# Patient Record
Sex: Female | Born: 1995 | Race: White | Hispanic: Yes | Smoking: Never smoker
Health system: Southern US, Community
[De-identification: ages and names within clinical notes are randomized; demographics above are authoritative.]

## PROBLEM LIST (undated history)

## (undated) DIAGNOSIS — R519 Headache, unspecified: Secondary | ICD-10-CM

## (undated) DIAGNOSIS — B999 Unspecified infectious disease: Secondary | ICD-10-CM

## (undated) DIAGNOSIS — R0600 Dyspnea, unspecified: Secondary | ICD-10-CM

## (undated) DIAGNOSIS — J45909 Unspecified asthma, uncomplicated: Secondary | ICD-10-CM

## (undated) HISTORY — DX: Headache, unspecified: R51.9

## (undated) HISTORY — DX: Unspecified infectious disease: B99.9

## (undated) HISTORY — DX: Dyspnea, unspecified: R06.00

## (undated) HISTORY — DX: Unspecified asthma, uncomplicated: J45.909

---
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
## 2019-02-21 ENCOUNTER — Other Ambulatory Visit: Payer: Self-pay

## 2019-02-27 ENCOUNTER — Ambulatory Visit (LOCAL_COMMUNITY_HEALTH_CENTER): Payer: Self-pay

## 2019-02-27 ENCOUNTER — Other Ambulatory Visit: Payer: Self-pay

## 2019-02-27 VITALS — BP 113/71 | Ht 61.0 in | Wt 164.5 lb

## 2019-02-27 DIAGNOSIS — Z3201 Encounter for pregnancy test, result positive: Secondary | ICD-10-CM

## 2019-02-27 LAB — PREGNANCY, URINE: Preg Test, Ur: POSITIVE — AB

## 2019-02-27 NOTE — Progress Notes (Signed)
Already has PNV supply. Pt unsure of where she wants to go for prenatal care; declined preadmit.

## 2019-03-27 ENCOUNTER — Other Ambulatory Visit: Payer: Self-pay

## 2019-03-27 ENCOUNTER — Ambulatory Visit: Payer: Medicaid Other | Admitting: Advanced Practice Midwife

## 2019-03-27 ENCOUNTER — Encounter: Payer: Self-pay | Admitting: Advanced Practice Midwife

## 2019-03-27 DIAGNOSIS — O99211 Obesity complicating pregnancy, first trimester: Secondary | ICD-10-CM

## 2019-03-27 DIAGNOSIS — Z23 Encounter for immunization: Secondary | ICD-10-CM

## 2019-03-27 DIAGNOSIS — Z3401 Encounter for supervision of normal first pregnancy, first trimester: Secondary | ICD-10-CM | POA: Diagnosis not present

## 2019-03-27 DIAGNOSIS — O99310 Alcohol use complicating pregnancy, unspecified trimester: Secondary | ICD-10-CM | POA: Insufficient documentation

## 2019-03-27 DIAGNOSIS — O99311 Alcohol use complicating pregnancy, first trimester: Secondary | ICD-10-CM

## 2019-03-27 LAB — URINALYSIS
Bilirubin, UA: NEGATIVE
Glucose, UA: NEGATIVE
Leukocytes,UA: NEGATIVE
Nitrite, UA: NEGATIVE
Protein,UA: NEGATIVE
RBC, UA: NEGATIVE
Specific Gravity, UA: 1.02 (ref 1.005–1.030)
Urobilinogen, Ur: 0.2 mg/dL (ref 0.2–1.0)
pH, UA: 7 (ref 5.0–7.5)

## 2019-03-27 LAB — WET PREP FOR TRICH, YEAST, CLUE
Trichomonas Exam: NEGATIVE
Yeast Exam: NEGATIVE

## 2019-03-27 LAB — HEMOGLOBIN, FINGERSTICK: Hemoglobin: 13.2 g/dL (ref 11.1–15.9)

## 2019-03-27 MED ORDER — PRENATAL VITAMINS 28-0.8 MG PO TABS: 1.0000 | ORAL_TABLET | Freq: Every day | ORAL | 0 refills | Status: DC

## 2019-03-27 NOTE — Progress Notes (Signed)
[facility] HEALTH DEPT [facility] [address] [place] [address] [phone]  INITIAL PRENATAL VISIT NOTE  Subjective:  Brooke Logan is a 23 y.o.SHF feels "happy" about surprise pregnancy with no birth control x 3 years.  23 yo FOB feels "happy" about pregnancy and is employed. Living with FOB and 1 other couple.  LMP 12/05/18.    G1P0000 at [redacted]w[redacted]d being seen today to start prenatal care at the [facility] Department.  She is currently monitored for the following issues for this low-risk pregnancy and has Obesity affecting pregnancy in first trimester BMI=30.7 and Encounter for supervision of normal first pregnancy in first trimester on their problem list.  Patient reports no complaints.  Contractions: Not present. Vag. Bleeding: None.  Movement: Absent. Denies leaking of fluid.   The following portions of the patient's history were reviewed and updated as appropriate: allergies, current medications, past family history, past medical history, past social history, past surgical history and problem list. Problem list updated.  Objective:   Vitals:   03/27/19 1355  BP: 112/71  Temp: 97.8 F (36.6 C)  Weight: 162 lb 9.6 oz (73.8 kg)    Fetal Status: Fetal Heart Rate (bpm): 160 Fundal Height: 10 cm Movement: Absent  Presentation: Undeterminable   Physical Exam Vitals and nursing note reviewed.  Constitutional:      General: She is not in acute distress.    Appearance: Normal appearance. She is well-developed.  HENT:     Head: Normocephalic and atraumatic.     Right Ear: External ear normal.     Left Ear: External ear normal.     Nose: Nose normal. No congestion or rhinorrhea.     Mouth/Throat:     Lips: Pink.     Mouth: Mucous membranes are moist.     Dentition: Normal dentition. No dental caries.     Pharynx: Oropharynx is clear. Uvula midline.  Eyes:     General: No scleral icterus.     Conjunctiva/sclera: Conjunctivae normal.  Neck:     Thyroid: No thyroid mass or thyromegaly.  Cardiovascular:     Rate and Rhythm: Normal rate.     Pulses: Normal pulses.     Comments: Extremities are warm and well perfused Pulmonary:     Effort: Pulmonary effort is normal.     Breath sounds: Normal breath sounds.  Chest:     Breasts: Breasts are symmetrical.        Right: Normal. No mass, nipple discharge or skin change.        Left: Normal. No mass, nipple discharge or skin change.  Abdominal:     General: Abdomen is flat.     Palpations: Abdomen is soft.     Tenderness: There is no abdominal tenderness.     Comments: Gravid soft without tenderness, FHR=160, fundal height 10 wks  Genitourinary:    General: Normal vulva.     Exam position: Lithotomy position.     Pubic Area: No rash.      Labia:        Right: No rash.        Left: No rash.      Vagina: Normal. No vaginal discharge (white creamy ph<4.5).     Cervix: Normal.     Uterus: Normal. Enlarged (Gravid 10-11 wks size). Not tender.      Adnexa: Right adnexa normal and left adnexa normal.     Rectum: Normal. No external hemorrhoid.  Musculoskeletal:     Right lower leg: No edema.     Left lower leg: No edema.  Lymphadenopathy:     Upper Body:     Right upper body: No axillary adenopathy.     Left upper body: No axillary adenopathy.  Skin:    General: Skin is warm.     Capillary Refill: Capillary refill takes less than 2 seconds.  Neurological:     Mental Status: She is alert.     Assessment and Plan:  Pregnancy: G1P0000 at [redacted]w[redacted]d  1. Obesity affecting pregnancy in first trimester BMI=30.7 -5 lb 6.4 oz (-2.449 kg)  Agrees to ASA 81 mg daily  - TSH + free T4  2. Encounter for supervision of normal first pregnancy in first trimester Desires FIRST screen - Glucose, 1 hour gestational - HGB FRAC. W/SOLUBILITY - QuantiFERON-TB Gold Plus - Prenatal profile without Varicella/Rubella ([id number]) - HIV [place]  LAB - Chlamydia/GC NAA, Confirmation - Protein / creatinine ratio, urine  (Spot) - Comprehensive metabolic panel - Hgb H6P w/o eAG - [id number] Drug Screen - Urinalysis (Urine Dip) - WET PREP FOR TRICH, YEAST, CLUE - Hemoglobin, venipuncture    Discussed overview of care and coordination with inpatient delivery practices including [facility], [facility], [facility] and [facility].   Reviewed Centering pregnancy as standard of care at [facility], oriented to room and showed video. Based on EDD, plan for Cycle    Preterm labor symptoms and general obstetric precautions including but not limited to vaginal bleeding, contractions, leaking of fluid and fetal movement were reviewed in detail with the patient.  Please refer to After Visit Summary for other counseling recommendations.   Return in about 4 weeks (around 04/24/2019) for routine PNC.  No future appointments.  Herbie Saxon, CNM

## 2019-03-27 NOTE — Progress Notes (Signed)
In for new ob; has PNV; ROI signed for 2019 Pap @ [facility]; desires flu vaccine; [facility] [place] and initial labs today Debera Lat, RN Wet prep reviewed-no Tx indicated; informed will be called with 1st screening appt Debera Lat, RN

## 2019-03-28 ENCOUNTER — Other Ambulatory Visit: Payer: Self-pay | Admitting: Advanced Practice Midwife

## 2019-03-28 ENCOUNTER — Telehealth: Payer: Self-pay

## 2019-03-28 DIAGNOSIS — Z369 Encounter for antenatal screening, unspecified: Secondary | ICD-10-CM

## 2019-03-28 LAB — 789231 7+OXYCODONE-BUND
Amphetamines, Urine: NEGATIVE ng/mL
BENZODIAZ UR QL: NEGATIVE ng/mL
Barbiturate screen, urine: NEGATIVE ng/mL
Cannabinoid Quant, Ur: NEGATIVE ng/mL
Cocaine (Metab.): NEGATIVE ng/mL
OPIATE SCREEN URINE: NEGATIVE ng/mL
Oxycodone/Oxymorphone, Urine: NEGATIVE ng/mL
PCP Quant, Ur: NEGATIVE ng/mL

## 2019-03-28 LAB — PROTEIN / CREATININE RATIO, URINE
Creatinine, Urine: 54.8 mg/dL
Protein, Ur: <4 mg/dL

## 2019-03-28 NOTE — Telephone Encounter (Signed)
Call to client-informed of DP 1st screening appt 04/03/19-by phone for Genetic and u/s @ 2:45. Debera Lat, RN

## 2019-03-29 LAB — URINE CULTURE

## 2019-03-29 LAB — CHLAMYDIA/GC NAA, CONFIRMATION
Chlamydia trachomatis, NAA: NEGATIVE
Neisseria gonorrhoeae, NAA: NEGATIVE

## 2019-03-30 ENCOUNTER — Other Ambulatory Visit: Payer: Self-pay

## 2019-03-30 DIAGNOSIS — O99311 Alcohol use complicating pregnancy, first trimester: Secondary | ICD-10-CM

## 2019-03-30 LAB — CBC/D/PLT+RPR+RH+ABO+AB SCR
Antibody Screen: NEGATIVE
Basophils Absolute: 0 10*3/uL (ref 0.0–0.2)
Basos: 1 %
EOS (ABSOLUTE): 0.1 10*3/uL (ref 0.0–0.4)
Eos: 2 %
Hematocrit: 40.2 % (ref 34.0–46.6)
Hemoglobin: 13.2 g/dL (ref 11.1–15.9)
Hepatitis B Surface Ag: NEGATIVE
Immature Grans (Abs): 0 10*3/uL (ref 0.0–0.1)
Immature Granulocytes: 0 %
Lymphocytes Absolute: 2.2 10*3/uL (ref 0.7–3.1)
Lymphs: 33 %
MCH: 28.2 pg (ref 26.6–33.0)
MCHC: 32.8 g/dL (ref 31.5–35.7)
MCV: 86 fL (ref 79–97)
Monocytes Absolute: 0.5 10*3/uL (ref 0.1–0.9)
Monocytes: 8 %
Neutrophils Absolute: 3.8 10*3/uL (ref 1.4–7.0)
Neutrophils: 56 %
Platelets: 304 10*3/uL (ref 150–450)
RBC: 4.68 x10E6/uL (ref 3.77–5.28)
RDW: 12.6 % (ref 11.7–15.4)
RPR Ser Ql: NONREACTIVE
Rh Factor: POSITIVE
WBC: 6.7 10*3/uL (ref 3.4–10.8)

## 2019-03-30 LAB — QUANTIFERON-TB GOLD PLUS
QuantiFERON Mitogen Value: 1.47 IU/mL
QuantiFERON Nil Value: 0.02 IU/mL
QuantiFERON TB1 Ag Value: 0.02 IU/mL
QuantiFERON TB2 Ag Value: 0.03 IU/mL
QuantiFERON-TB Gold Plus: NEGATIVE

## 2019-03-30 LAB — TSH+FREE T4
Free T4: 1.1 ng/dL (ref 0.82–1.77)
TSH: 0.927 u[IU]/mL (ref 0.450–4.500)

## 2019-03-30 LAB — HGB FRAC. W/SOLUBILITY
Hgb A2 Quant: 2.4 % (ref 1.8–3.2)
Hgb A: 97 % (ref 96.4–98.8)
Hgb C: 0 %
Hgb F Quant: 0.6 % (ref 0.0–2.0)
Hgb S: 0 %
Hgb Solubility: NEGATIVE
Hgb Variant: 0 %

## 2019-03-30 LAB — COMPREHENSIVE METABOLIC PANEL
ALT: 14 IU/L (ref 0–32)
AST: 17 IU/L (ref 0–40)
Albumin/Globulin Ratio: 1.8 (ref 1.2–2.2)
Albumin: 4.3 g/dL (ref 3.9–5.0)
Alkaline Phosphatase: 45 IU/L (ref 39–117)
BUN/Creatinine Ratio: 15 (ref 9–23)
BUN: 8 mg/dL (ref 6–20)
Bilirubin Total: <0.2 mg/dL (ref 0.0–1.2)
CO2: 21 mmol/L (ref 20–29)
Calcium: 9.1 mg/dL (ref 8.7–10.2)
Chloride: 103 mmol/L (ref 96–106)
Creatinine, Ser: 0.55 mg/dL — ABNORMAL LOW (ref 0.57–1.00)
GFR calc Af Amer: 154 mL/min/{1.73_m2} (ref >59)
GFR calc non Af Amer: 134 mL/min/{1.73_m2} (ref >59)
Globulin, Total: 2.4 g/dL (ref 1.5–4.5)
Glucose: 97 mg/dL (ref 65–99)
Potassium: 4.1 mmol/L (ref 3.5–5.2)
Sodium: 137 mmol/L (ref 134–144)
Total Protein: 6.7 g/dL (ref 6.0–8.5)

## 2019-03-30 LAB — GLUCOSE, 1 HOUR GESTATIONAL: Gestational Diabetes Screen: 86 mg/dL (ref 65–139)

## 2019-03-30 LAB — HGB A1C W/O EAG: Hgb A1c MFr Bld: 5.1 % (ref 4.8–5.6)

## 2019-04-03 ENCOUNTER — Other Ambulatory Visit: Payer: Self-pay

## 2019-04-03 ENCOUNTER — Ambulatory Visit
Admission: RE | Admit: 2019-04-03 | Discharge: 2019-04-03 | Disposition: A | Discharge status: Home / Self Care | Payer: Self-pay | Source: Ambulatory Visit | Attending: Maternal & Fetal Medicine | Admitting: Maternal & Fetal Medicine

## 2019-04-03 ENCOUNTER — Ambulatory Visit (HOSPITAL_BASED_OUTPATIENT_CLINIC_OR_DEPARTMENT_OTHER)
Admission: RE | Admit: 2019-04-03 | Discharge: 2019-04-03 | Disposition: A | Discharge status: Home / Self Care | Payer: Self-pay | Source: Ambulatory Visit | Attending: Maternal & Fetal Medicine | Admitting: Maternal & Fetal Medicine

## 2019-04-03 DIAGNOSIS — Z36 Encounter for antenatal screening for chromosomal anomalies: Secondary | ICD-10-CM

## 2019-04-03 DIAGNOSIS — Z3A12 12 weeks gestation of pregnancy: Secondary | ICD-10-CM | POA: Insufficient documentation

## 2019-04-03 DIAGNOSIS — Z369 Encounter for antenatal screening, unspecified: Secondary | ICD-10-CM

## 2019-04-03 DIAGNOSIS — Z3689 Encounter for other specified antenatal screening: Secondary | ICD-10-CM | POA: Insufficient documentation

## 2019-04-03 NOTE — Progress Notes (Signed)
Virtual Visit via Telephone Note  I connected with Brooke Logan on 04/03/2019 at 11:00 AM EST by telephone and verified that I am speaking with the correct person using two identifiers.  Referring provider:  [facility] Department Length of consultation: 30 minutes  Ms. Brooke Logan was referred to [facility] [place] for genetic counseling to review prenatal screening and testing options.  This note summarizes the information we discussed.    We offered the following routine screening tests for this pregnancy:  The most accurate screening option for chromosome conditions is cell free fetal DNA testing.  Though this is typically reserved for pregnancies at increased risk for aneuploidy, it is currently being made available and many insurance companies are adding coverage for this testing in low risk patients during COVID.  This test utilizes a maternal blood sample and DNA sequencing technology to isolate circulating cell free fetal DNA from maternal plasma.  The fetal DNA can then be analyzed for DNA sequences that are derived from the three most common chromosomes involved in aneuploidy, chromosomes 13, 18, and 21.  If the overall amount of DNA is greater than the expected level for any of these chromosomes, aneuploidy is suspected.  The detection rates are >99% for Down syndrome, >98% for trisomy 18 and >91% for Trisomy 13.  While we do not consider it a replacement for invasive testing and karyotype analysis, a negative result from this testing would be reassuring, though not a guarantee of a normal chromosome complement for the baby.  An abnormal result may be suggestive of an abnormal chromosome complement, though we would still recommend CVS or amniocentesis to confirm any findings from this testing.  First trimester screening, which includes nuchal translucency ultrasound screen and first trimester maternal serum marker screening, is the test  that has most recently been available for low risk patients.  The nuchal translucency has approximately an 80% detection rate for Down syndrome and can be positive for other chromosome abnormalities as well as congenital heart defects.  When combined with a maternal serum marker screening, the detection rate is up to 90% for Down syndrome and up to 97% for trisomy 18.   Given current recommendations during COVID, we are offering only the biochemical testing portion of this testing (without the ultrasound and NT portion), which has a much lower detection rate.  Maternal serum marker screening, or "quad" screen, is a blood test that measures pregnancy proteins, can provide risk assessments for Down syndrome, trisomy 18, and open neural tube defects (spina bifida, anencephaly). Because it does not directly examine the fetus, it cannot positively diagnose or rule out these problems. This is a second trimester option which could be offered along with the anatomy ultrasound. It can detect approximately 75% of babies with Down syndrome, 80% of babies with open spina bifida and 70% of babies with trisomy 30.  Targeted ultrasound uses high frequency sound waves to create an image of the developing fetus.  An ultrasound is often recommended as a routine means of evaluating the pregnancy.  It is also used to screen for fetal anatomy problems (for example, a heart defect) that might be suggestive of a chromosomal or other abnormality. We are currently not recommending a first trimester ultrasound other than that which would be ordered for dating and viability.  Should these screening tests indicate an increased concern, then the following additional testing options would be offered:  The chorionic villus sampling procedure is available for first  trimester chromosome analysis.  This involves the withdrawal of a small amount of chorionic villi (tissue from the developing placenta).  Risk of pregnancy loss is estimated to  be approximately 1 in 200 to 1 in 100 (0.5 to 1%).  There is approximately a 1% (1 in 100) chance that the CVS chromosome results will be unclear.  Chorionic villi cannot be tested for neural tube defects.     Amniocentesis involves the removal of a small amount of amniotic fluid from the sac surrounding the fetus with the use of a thin needle inserted through the maternal abdomen and uterus.  Ultrasound guidance is used throughout the procedure.  Fetal cells from amniotic fluid are directly evaluated and > 99.5% of chromosome problems and > 98% of open neural tube defects can be detected. This procedure is generally performed after the 15th week of pregnancy.  The main risks to this procedure include complications leading to miscarriage in less than 1 in 200 cases (0.5%).  Cystic Fibrosis and Spinal Muscular Atrophy (SMA) screening were also discussed with the patient. Both conditions are recessive, which means that both parents must be carriers in order to have a child with the disease.  Cystic fibrosis (CF) is one of the most common genetic conditions in persons of Caucasian ancestry.  This condition occurs in approximately 1 in 2,500 Caucasian persons and results in thickened secretions in the lungs, digestive, and reproductive systems.  For a baby to be at risk for having CF, both of the parents must be carriers for this condition.  Approximately 1 in 5825 Caucasian persons is a carrier for CF.  Current carrier testing looks for the most common mutations in the gene for CF and can detect approximately 90% of carriers in the Caucasian population.  This means that the carrier screening can greatly reduce, but cannot eliminate, the chance for an individual to have a child with CF.  If an individual is found to be a carrier for CF, then carrier testing would be available for the partner. As part of [place]'s newborn screening profile, all babies born in the state of [place] will have a two-tier  screening process.  Specimens are first tested to determine the concentration of immunoreactive trypsinogen (IRT).  The top 5% of specimens with the highest IRT values then undergo DNA testing using a panel of over 40 common CF mutations. SMA is a neurodegenerative disorder that leads to atrophy of skeletal muscle and overall weakness.  This condition is also more prevalent in the Caucasian population, with 1 in 40-1 in 60 persons being a carrier and 1 in 6,000-1 in 10,000 children being affected.  There are multiple forms of the disease, with some causing death in infancy to other forms with survival into adulthood.  The genetics of SMA is complex, but carrier screening can detect up to 95% of carriers in the Caucasian population.  Similar to CF, a negative result can greatly reduce, but cannot eliminate, the chance to have a child with SMA. The patient declined carrier screening for CF and SMA.  We talked about the option of signing up for Early Check to have the baby tested for SMA after delivery as part of a new study in [place].  This registration can be done online prior to delivery if desired.  We obtained a detailed family history and pregnancy history.  This is the first pregnancy for this couple. The patient denied exposures of medications, alcohol, recreational drugs or tobacco.  She  also reported no complications in this pregnancy thus far. In the family history, the patient stated that her mother had 4 early miscarriages but went on to have 5 healthy children.  She is not aware of the cause for the losses. On the father of the baby's family, there is one paternal aunt who has had several preterm deliveries including a set of twins who passed away due to prematurity.  We reviewed that there may be many reasons for pregnancy loss or for preterm delivery including cervical incompetence, clotting disorders, immune system factors, structural uterine anomalies and chromosome translocations.  Without additional  information it is difficult to assess the level of concern for this patient, though it is encouraging that the remainder of the family history was reported to be unremarkable for birth defects, intellectual delays, recurrent pregnancy loss or known chromosome abnormalities.  After consideration of the options, Ms. Brooke Figueroa elected to have MaterniT21 PLUS with SCA drawn at [facility] following her dating/viability ultrasound on 04/03/2019.  The patient declined carrier testing for CF and SMA. Prior testing for hemoglobinopathies was normal.  The patient was encouraged to call with questions or concerns.  We can be contacted at [phone].  Labs ordered: MaterniT21 PLUS with SCA  Wilburt Finlay, MS, CGC  I provided 30 minutes of non-face-to-face time during this encounter.   Donette Larry

## 2019-04-05 LAB — HM HIV SCREENING LAB: HM HIV Screening: NEGATIVE

## 2019-04-08 LAB — MATERNIT21 PLUS CORE+SCA
Fetal Fraction: 11
Monosomy X (Turner Syndrome): NOT DETECTED
Result (T21): NEGATIVE
Trisomy 13 (Patau syndrome): NEGATIVE
Trisomy 18 (Edwards syndrome): NEGATIVE
Trisomy 21 (Down syndrome): NEGATIVE
XXX (Triple X Syndrome): NOT DETECTED
XXY (Klinefelter Syndrome): NOT DETECTED
XYY (Jacobs Syndrome): NOT DETECTED

## 2019-04-10 ENCOUNTER — Telehealth: Payer: Self-pay | Admitting: Obstetrics and Gynecology

## 2019-04-10 NOTE — Telephone Encounter (Signed)
The patient was informed of the results of her recent MaterniT21 testing which yielded NEGATIVE results.  The patient's specimen showed DNA consistent with two copies of chromosomes 21, 18 and 13.  The sensitivity for trisomy 13, trisomy 81 and trisomy 29 using this testing are reported as 99.1%, 99.9% and 91.7% respectively.  Thus, while the results of this testing are highly accurate, they are not considered diagnostic at this time.  Should more definitive information be desired, the patient may still consider amniocentesis.   As requested to know by the patient, sex chromosome analysis was included for this sample and those results were verbally given to the patient's sister by phone at the time of the results call. This is predicted with >99% accuracy.  A maternal serum AFP only should be considered if screening for neural tube defects is desired.  We may be reached at [phone] with any questions or concerns.   Wilburt Finlay, MS, CGC

## 2019-04-12 ENCOUNTER — Encounter: Payer: Self-pay | Admitting: Family Medicine

## 2019-04-12 ENCOUNTER — Encounter: Payer: Self-pay | Admitting: Advanced Practice Midwife

## 2019-04-12 DIAGNOSIS — Z3401 Encounter for supervision of normal first pregnancy, first trimester: Secondary | ICD-10-CM

## 2019-04-14 NOTE — L&D Delivery Note (Signed)
Delivery Note  Date of delivery: 10/12/2019 Estimated Date of Delivery: 10/12/19 Patient's last menstrual period was 01/05/2019 (within days). EGA: [redacted]w[redacted]d  Delivery Note At 7:28 PM a viable female was delivered via Vaginal, Spontaneous (Presentation: Left Occiput Anterior).  APGAR: 5, 9; weight  8lbs 8.5oz (3870g).   Placenta status: Spontaneous, Intact.  Cord: 3 vessels with the following complications:  none.    Anesthesia: Epidural Episiotomy: None Lacerations: 1st degree;Periurethral Suture Repair: 3.0 vicryl Est. Blood Loss (mL):  700  First Stage: Labor onset: 0730 Augmentation : Pitocin during 2nd stage Analgesia /Anesthesia intrapartum: Epidural SROM at 0730  Brooke Logan presented to L&D with UC and SROM. She was expectantly managed. Epidural placed.   Second Stage: Complete dilation at 1700 Onset of pushing at 1606 FHR second stage Cat II Delivery at 1928 on 10/12/2019  She progressed to complete and had a spontaneous vaginal birth of a live female over an intact perineum. The fetal head was delivered in OA position with restitution to LOA. No nuchal cord. Anterior then posterior shoulders delivered spontaneously. Baby placed on mom's abdomen and attended to by transition RN. Baby required more attention so the cord clamped and cut quickly by FOB and baby was taken to the warmer. Cord blood obtained for newborn labs.  Third Stage: Placenta delivered intact with 3VC at 1939 Placenta disposition: pathology Uterine tone firm / bleeding min IV pitocin given for hemorrhage prophylaxis 800 mg of Cytotec given for prophylaxis d/t maternal temp at delivery  1st degree vaginal and periurethral laceration identified  Anesthesia for repair: Epidural and local lidocaine 1% Repair 3.0 vicryl Est. Blood Loss (mL): 700  Complications: Maternal temp at delivery   Mom to postpartum.  Baby to Couplet care / Skin to Skin.  Newborn: Birth Weight: pending  Apgar Scores:  5, 9 Feeding planned: breast   Brooke Logan, CNM 10/12/2019 8:15 PM

## 2019-04-24 ENCOUNTER — Ambulatory Visit: Payer: Self-pay | Admitting: Family Medicine

## 2019-04-24 ENCOUNTER — Ambulatory Visit: Payer: Self-pay

## 2019-04-24 ENCOUNTER — Other Ambulatory Visit: Payer: Self-pay

## 2019-04-24 ENCOUNTER — Encounter: Payer: Self-pay | Admitting: Family Medicine

## 2019-04-24 DIAGNOSIS — O99211 Obesity complicating pregnancy, first trimester: Secondary | ICD-10-CM

## 2019-04-24 DIAGNOSIS — Z3401 Encounter for supervision of normal first pregnancy, first trimester: Secondary | ICD-10-CM | POA: Diagnosis not present

## 2019-04-24 NOTE — Progress Notes (Signed)
   PRENATAL VISIT NOTE  Subjective:  Brooke Logan is a 24 y.o. G1P0000 at [redacted]w[redacted]d being seen today for ongoing prenatal care.  She is currently monitored for the following issues for this low-risk pregnancy and has Obesity affecting pregnancy in first trimester BMI=30.7; Encounter for supervision of normal first pregnancy in first trimester; Alcohol use affecting pregnancy; and Encounter for antenatal screening for chromosomal anomalies on their problem list.  Patient reports no complaints.  Contractions: Not present. Vag. Bleeding: None.  Movement: Absent. Denies leaking of fluid/ROM.   The following portions of the patient's history were reviewed and updated as appropriate: allergies, current medications, past family history, past medical history, past social history, past surgical history and problem list. Problem list updated.  Objective:   Vitals:   04/24/19 0945  BP: 107/72  Pulse: 85  Temp: 97.9 F (36.6 C)  Weight: 161 lb (73 kg)    Fetal Status: Fetal Heart Rate (bpm): 160 Fundal Height: 15 cm Movement: Absent     General:  Alert, oriented and cooperative. Patient is in no acute distress.  Skin: Skin is warm and dry. No rash noted.   Cardiovascular: Normal heart rate noted  Respiratory: Normal respiratory effort, no problems with respiration noted  Abdomen: Soft, gravid, appropriate for gestational age.  Pain/Pressure: Absent     Pelvic: Cervical exam deferred        Extremities: Normal range of motion.  Edema: None  Mental Status: Normal mood and affect. Normal behavior. Normal judgment and thought content.   Assessment and Plan:  Pregnancy: G1P0000 at [redacted]w[redacted]d    1. Encounter for supervision of normal first pregnancy in first trimester -Had genetic counseling- NIPT and 1st screen negative, getting AFP today.  -Has anatomy u/s already scheduled through [facility] for 1/25.  - AFP Only [facility]- Scanned result  2. Obesity affecting pregnancy in first trimester  BMI=30.7 -Healthy weight gain, diet and exercise discussed. Pt declines MNT referral. -Pt is taking aspirin, continue to 36 wks -Getting repeat UPCR today, last one too dilute. - Protein / creatinine ratio, urine  (Spot)   Preterm labor symptoms and general obstetric precautions including but not limited to vaginal bleeding, contractions, leaking of fluid and fetal movement were reviewed in detail with the patient. Please refer to After Visit Summary for other counseling recommendations.  Return in about 4 weeks (around 05/22/2019) for routine prenatal care.  Future Appointments  Date Time Provider Department Center  05/08/2019  9:00 AM [facility] [place] [facility]    Ann Held, PA-C

## 2019-04-24 NOTE — Progress Notes (Signed)
In for visit; denies hospital visits; taking PNV; discussed AFP only testing-desires today Sharlette Dense, RN

## 2019-04-25 LAB — PROTEIN / CREATININE RATIO, URINE
Creatinine, Urine: 37.8 mg/dL
Protein, Ur: 6.4 mg/dL
Protein/Creat Ratio: 169 mg/g creat (ref 0–200)

## 2019-05-04 ENCOUNTER — Other Ambulatory Visit: Payer: Self-pay

## 2019-05-04 DIAGNOSIS — Z3A18 18 weeks gestation of pregnancy: Secondary | ICD-10-CM

## 2019-05-08 ENCOUNTER — Other Ambulatory Visit: Payer: Self-pay

## 2019-05-08 NOTE — Addendum Note (Signed)
Addended by: Heywood Bene on: 05/08/2019 11:02 AM   Modules accepted: Orders

## 2019-05-09 ENCOUNTER — Other Ambulatory Visit: Payer: Self-pay | Admitting: Maternal & Fetal Medicine

## 2019-05-09 DIAGNOSIS — Z3689 Encounter for other specified antenatal screening: Secondary | ICD-10-CM

## 2019-05-11 ENCOUNTER — Telehealth: Payer: Self-pay

## 2019-05-11 ENCOUNTER — Ambulatory Visit
Admission: RE | Admit: 2019-05-11 | Discharge: 2019-05-11 | Disposition: A | Discharge status: Home / Self Care | Payer: Self-pay | Source: Ambulatory Visit | Attending: Maternal & Fetal Medicine | Admitting: Maternal & Fetal Medicine

## 2019-05-11 ENCOUNTER — Other Ambulatory Visit: Payer: Self-pay

## 2019-05-11 DIAGNOSIS — Z3689 Encounter for other specified antenatal screening: Secondary | ICD-10-CM | POA: Insufficient documentation

## 2019-05-11 DIAGNOSIS — Z3A18 18 weeks gestation of pregnancy: Secondary | ICD-10-CM | POA: Insufficient documentation

## 2019-05-11 NOTE — Telephone Encounter (Signed)
TC from Remigio Eisenmenger at [facility] stating that patient AFP screen sample was too old to run when they received it and [facility] is cancelling the order. RN to call patient to offer redraw.Burt Knack, RN

## 2019-05-11 NOTE — Telephone Encounter (Signed)
TC to patient to explain that AFP test from 04/24/2019 was cancelled by [facility] due to blood sample being to old to run test when they received it (see previous phone note). Offered patient chance to schedule lab visit for redraw of AFP, and explained to patient the reasons for doing this as soon as possible. Patient declines to come for lab test, states she is reassured by previous testing and U/S today, and would consider having AFP only drawn at 05/22/19 [facility] appointment. Burt Knack, RN

## 2019-05-15 ENCOUNTER — Other Ambulatory Visit: Payer: Self-pay

## 2019-05-22 ENCOUNTER — Ambulatory Visit: Payer: Self-pay | Admitting: Family Medicine

## 2019-05-22 ENCOUNTER — Other Ambulatory Visit: Payer: Self-pay

## 2019-05-22 ENCOUNTER — Encounter: Payer: Self-pay | Admitting: Family Medicine

## 2019-05-22 VITALS — BP 115/73 | HR 94 | Temp 97.6°F | Wt 164.8 lb

## 2019-05-22 DIAGNOSIS — Z3401 Encounter for supervision of normal first pregnancy, first trimester: Secondary | ICD-10-CM

## 2019-05-22 DIAGNOSIS — G5600 Carpal tunnel syndrome, unspecified upper limb: Secondary | ICD-10-CM

## 2019-05-22 DIAGNOSIS — O99211 Obesity complicating pregnancy, first trimester: Secondary | ICD-10-CM

## 2019-05-22 NOTE — Progress Notes (Signed)
Pt denies visits to ER since last appt with [facility]. Taking PNV.

## 2019-05-22 NOTE — Progress Notes (Signed)
   PRENATAL VISIT NOTE  Subjective:  Brooke Logan is a 24 y.o. G1P0000 at [redacted]w[redacted]d being seen today for ongoing prenatal care.  She is currently monitored for the following issues for this low-risk pregnancy and has Obesity affecting pregnancy in first trimester BMI=30.7; Encounter for supervision of normal first pregnancy in first trimester; Alcohol use affecting pregnancy; and Encounter for antenatal screening for chromosomal anomalies on their problem list.  Patient reports carpal tunnel symptoms -R wrist pain at night. No hx of this pain. No computer work.   Contractions: Not present. Vag. Bleeding: None.  Movement: Absent. Denies leaking of fluid/ROM.   The following portions of the patient's history were reviewed and updated as appropriate: allergies, current medications, past family history, past medical history, past social history, past surgical history and problem list. Problem list updated.  Objective:   Vitals:   05/22/19 0903 05/22/19 0906  BP: 115/73   Pulse: (!) 103 94  Temp: 97.6 F (36.4 C)   Weight: 164 lb 12.8 oz (74.8 kg)     Fetal Status: Fetal Heart Rate (bpm): 140 Fundal Height: 19 cm Movement: Absent     General:  Alert, oriented and cooperative. Patient is in no acute distress.  Skin: Skin is warm and dry. No rash noted.   Cardiovascular: Normal heart rate noted  Respiratory: Normal respiratory effort, no problems with respiration noted  Abdomen: Soft, gravid, appropriate for gestational age.  Pain/Pressure: Absent     Pelvic: Cervical exam deferred        Extremities: Normal range of motion.  Edema: None No swelling or redness of R wrist, ROM intact.  Mental Status: Normal mood and affect. Normal behavior. Normal judgment and thought content.   Assessment and Plan:  Pregnancy: G1P0000 at [redacted]w[redacted]d   1. Encounter for supervision of normal first pregnancy in first trimester -Up to date. Pt declines repeat AFP (prior blood sample too old per  [facility]). Anatomy US discussed, WNL.   2. Obesity affecting pregnancy in first trimester BMI=30.7 TWG = -3 lb 3.2 oz (-1.452 kg)  3. Carpal tunnel syndrome during pregnancy -Carpal tunnel diagnosis discussed. Advised wrist brace at night (or during day if symptoms present then as well), rest, ice as needed. Let us know if symptoms worsening. Handout given.     Preterm labor symptoms and general obstetric precautions including but not limited to vaginal bleeding, contractions, leaking of fluid and fetal movement were reviewed in detail with the patient. Please refer to After Visit Summary for other counseling recommendations.  Return in about 4 weeks (around 06/19/2019) for routine prenatal care.  Future Appointments  Date Time Provider Department Center  06/19/2019  8:20 AM [facility] PROVIDER [facility] None    Ann Held, PA-C

## 2019-05-24 NOTE — Progress Notes (Signed)
See phone note-AFP unable to process due to sample out of date range.

## 2019-05-24 NOTE — Addendum Note (Signed)
Addended by: Sharlette Dense on: 05/24/2019 02:42 PM   Modules accepted: Orders

## 2019-06-19 ENCOUNTER — Ambulatory Visit: Payer: Self-pay | Admitting: Advanced Practice Midwife

## 2019-06-19 ENCOUNTER — Other Ambulatory Visit: Payer: Self-pay

## 2019-06-19 VITALS — BP 119/83 | HR 88 | Temp 98.7°F | Wt 169.0 lb

## 2019-06-19 DIAGNOSIS — Z3401 Encounter for supervision of normal first pregnancy, first trimester: Secondary | ICD-10-CM

## 2019-06-19 DIAGNOSIS — O99311 Alcohol use complicating pregnancy, first trimester: Secondary | ICD-10-CM

## 2019-06-19 DIAGNOSIS — O99211 Obesity complicating pregnancy, first trimester: Secondary | ICD-10-CM

## 2019-06-19 DIAGNOSIS — J45909 Unspecified asthma, uncomplicated: Secondary | ICD-10-CM | POA: Insufficient documentation

## 2019-06-19 DIAGNOSIS — O99512 Diseases of the respiratory system complicating pregnancy, second trimester: Secondary | ICD-10-CM

## 2019-06-19 MED ORDER — PRENATAL VITAMIN 27-0.8 MG PO TABS: 1.0000 | ORAL_TABLET | Freq: Every day | ORAL | 0 refills | Status: AC

## 2019-06-19 NOTE — Progress Notes (Signed)
   PRENATAL VISIT NOTE  Subjective:  Brooke Logan is a 24 y.o. G1P0000 at [redacted]w[redacted]d being seen today for ongoing prenatal care.  She is currently monitored for the following issues for this low-risk pregnancy and has Obesity affecting pregnancy in first trimester BMI=31.9; Encounter for supervision of normal first pregnancy in first trimester; Alcohol use affecting pregnancy; Encounter for antenatal screening for chromosomal anomalies; and Asthma affecting pregnancy in second trimester on their problem list.  Patient reports chest pain and SOB Friday night.  Contractions: Not present. Vag. Bleeding: None.  Movement: Present. Denies leaking of fluid/ROM.   The following portions of the patient's history were reviewed and updated as appropriate: allergies, current medications, past family history, past medical history, past social history, past surgical history and problem list. Problem list updated.  Objective:   Vitals:   06/19/19 0821  BP: 119/83  Pulse: 88  Temp: 98.7 F (37.1 C)  Weight: 169 lb (76.7 kg)    Fetal Status: Fetal Heart Rate (bpm): 150 Fundal Height: 23 cm Movement: Present     General:  Alert, oriented and cooperative. Patient is in no acute distress.  Skin: Skin is warm and dry. No rash noted.   Cardiovascular: Normal heart rate noted  Respiratory: Normal respiratory effort, no problems with respiration noted  Abdomen: Soft, gravid, appropriate for gestational age.  Pain/Pressure: Absent     Pelvic: Cervical exam deferred        Extremities: Normal range of motion.  Edema: None  Mental Status: Normal mood and affect. Normal behavior. Normal judgment and thought content.   Assessment and Plan:  Pregnancy: G1P0000 at [redacted]w[redacted]d  1. Encounter for supervision of normal first pregnancy in first trimester States stopped taking ASA and is no longer taking Working 18-24 hrs/wk - Prenatal Vit-Fe Fumarate-FA (PRENATAL VITAMIN) 27-0.8 MG TABS; Take 1 tablet by  mouth daily.  Dispense: 100 tablet; Refill: 0  2. Obesity affecting pregnancy in first trimester BMI=31.9 1 lb (0.454 kg) Plan 1 hour glucola next appt  3. Asthma affecting pregnancy in second trimester C/o SOB and chest pain Friday night while sleeping; admits to hx asthma but has no inhaler and felt sxs due to asthma--rx given for Albuterol  4. Alcohol use affecting pregnancy in first trimester States no use since 02/11/19   Preterm labor symptoms and general obstetric precautions including but not limited to vaginal bleeding, contractions, leaking of fluid and fetal movement were reviewed in detail with the patient. Please refer to After Visit Summary for other counseling recommendations.  Return in about 4 weeks (around 07/17/2019) for routine PNC.  No future appointments.  Alberteen Spindle, CNM

## 2019-06-19 NOTE — Progress Notes (Signed)
In for visit; taking PNV, more given; undecided on Peds; CCNC & PhQ9 completed; denies hospital visits since last appt; discussed labs needed @ next visit Sharlette Dense, RN

## 2019-06-20 ENCOUNTER — Other Ambulatory Visit: Payer: Self-pay

## 2019-06-20 ENCOUNTER — Emergency Department
Admission: EM | Admit: 2019-06-20 | Discharge: 2019-06-20 | Disposition: A | Discharge status: Home / Self Care | Payer: HRSA Program | Attending: Emergency Medicine | Admitting: Emergency Medicine

## 2019-06-20 ENCOUNTER — Encounter: Payer: Self-pay | Admitting: Emergency Medicine

## 2019-06-20 DIAGNOSIS — J069 Acute upper respiratory infection, unspecified: Secondary | ICD-10-CM | POA: Diagnosis not present

## 2019-06-20 DIAGNOSIS — R05 Cough: Secondary | ICD-10-CM | POA: Diagnosis present

## 2019-06-20 DIAGNOSIS — Z20822 Contact with and (suspected) exposure to covid-19: Secondary | ICD-10-CM | POA: Insufficient documentation

## 2019-06-20 DIAGNOSIS — J45909 Unspecified asthma, uncomplicated: Secondary | ICD-10-CM | POA: Insufficient documentation

## 2019-06-20 DIAGNOSIS — Z1152 Encounter for screening for COVID-19: Secondary | ICD-10-CM

## 2019-06-20 LAB — URINALYSIS, COMPLETE (UACMP) WITH MICROSCOPIC
Bacteria, UA: NONE SEEN
Bilirubin Urine: NEGATIVE
Glucose, UA: NEGATIVE mg/dL
Hgb urine dipstick: NEGATIVE
Ketones, ur: NEGATIVE mg/dL
Leukocytes,Ua: NEGATIVE
Nitrite: NEGATIVE
Protein, ur: NEGATIVE mg/dL
Specific Gravity, Urine: 1.001 — ABNORMAL LOW (ref 1.005–1.030)
Squamous Epithelial / HPF: NONE SEEN (ref 0–5)
pH: 8 (ref 5.0–8.0)

## 2019-06-20 LAB — GROUP A STREP BY PCR: Group A Strep by PCR: NOT DETECTED

## 2019-06-20 LAB — OB RESULTS CONSOLE GBS: GBS: NEGATIVE

## 2019-06-20 NOTE — ED Notes (Signed)
See triage note  Presents with sore throat,cough and some back pain which started on Friday  Also states she had subjective fever last pm  Afebrile on arrival

## 2019-06-20 NOTE — ED Provider Notes (Signed)
[facility] Emergency Department Provider Note   ____________________________________________   First MD Initiated Contact with Patient 06/20/19 1312     (approximate)  I have reviewed the triage vital signs and the nursing notes.   HISTORY  Chief Complaint Covid Test   HPI Brooke Logan is a 24 y.o. female presents to the ED with complaint of headache, cough, body aches for the last 2 days.  Patient states she "felt warm".  Patient denies any nausea, vomiting, diarrhea or urinary symptoms..  Patient is currently [redacted] weeks pregnant and this is her first pregnancy.  She was told that she should come to the ED to be checked out.  She also states that when she coughs her back hurts.  She is unaware of any exposure to Covid but states that she was exposed to her cousin who had strep throat.  She also is anxious because she "has not felt the baby move today".  Currently she rates her pain as a 5 out of 10.       Past Medical History:  Diagnosis Date  . 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 ago  . Dyspnea    Reports feeling SOB @ night & wakes her up  . Headache    Reports Migraine HA's 2x/wk-not had x 2 mths.  . Infection    As a child-developed wound infection, hospitalized 3 mths.-[place]    Patient Active Problem List   Diagnosis Date Noted  . Asthma affecting pregnancy in second trimester 06/19/2019  . Encounter for antenatal screening for chromosomal anomalies   . Obesity affecting pregnancy in first trimester BMI=31.9 03/27/2019  . Encounter for supervision of normal first pregnancy in first trimester 03/27/2019  . Alcohol use affecting pregnancy 03/27/2019    Past Surgical History:  Procedure Laterality Date  . APPENDECTOMY  2013    Prior to Admission medications   Medication Sig Start Date End Date Taking? Authorizing Provider  Prenatal Vit-Fe Fumarate-FA (PRENATAL  VITAMIN) 27-0.8 MG TABS Take 1 tablet by mouth daily. 06/19/19   Herbie Saxon, CNM    Allergies Patient has no known allergies.  Family History  Problem Relation Age of Onset  . Heart disease Paternal Grandmother     Social History Social History   Tobacco Use  . Smoking status: Never Smoker  . Smokeless tobacco: Never Used  Substance Use Topics  . Alcohol use: Not Currently    Comment: Last 02/10/19  . Drug use: Never    Review of Systems Constitutional: Subjective fever/chills Eyes: No visual changes. ENT: Positive sore throat. Cardiovascular: Denies chest pain. Respiratory: Denies shortness of breath.  Positive for cough.  Gastrointestinal: No abdominal pain.  No nausea, no vomiting.  No diarrhea.  No constipation. Genitourinary: Initially negative for dysuria however patient complained of dysuria after being in the ED. Musculoskeletal: Negative for back pain. Skin: Negative for rash. Neurological: Positive for headaches, negative for focal weakness or numbness. ____________________________________________   PHYSICAL EXAM:  VITAL SIGNS: ED Triage Vitals  Enc Vitals Group     BP 06/20/19 1242 122/78     Pulse Rate 06/20/19 1242 100     Resp 06/20/19 1242 17     Temp 06/20/19 1242 98.9 F (37.2 C)     Temp src --      SpO2 06/20/19 1242 99 %     Weight 06/20/19 1243 169 lb (  76.7 kg)     Height 06/20/19 1243 5\' 2"  (1.575 m)     Head Circumference --      Peak Flow --      Pain Score 06/20/19 1242 5     Pain Loc --      Pain Edu? --      Excl. in GC? --    Constitutional: Alert and oriented. Well appearing and in no acute distress.  Nontoxic in appearance. Eyes: Conjunctivae are normal.  Head: Atraumatic. Nose: No congestion/rhinnorhea. Mouth/Throat: Mucous membranes are moist.  Oropharynx non-erythematous. Neck: No stridor.   Hematological/Lymphatic/Immunilogical: No cervical lymphadenopathy. Cardiovascular: Normal rate, regular rhythm. Grossly  normal heart sounds.  Good peripheral circulation. Respiratory: Normal respiratory effort.  No retractions. Lungs CTAB. Gastrointestinal: Soft and nontender.  No CVA tenderness. Musculoskeletal: Moves upper and lower extremities any difficulty.  Normal gait was noted.  Patient was ambulatory without any assistance. Neurologic:  Normal speech and language. No gross focal neurologic deficits are appreciated. No gait instability. Skin:  Skin is warm, dry and intact. No rash noted. Psychiatric: Mood and affect are normal. Speech and behavior are normal.  ____________________________________________   LABS (all labs ordered are listed, but only abnormal results are displayed)  Labs Reviewed  URINALYSIS, COMPLETE (UACMP) WITH MICROSCOPIC - Abnormal; Notable for the following components:      Result Value   Color, Urine COLORLESS (*)    APPearance CLEAR (*)    Specific Gravity, Urine 1.001 (*)    All other components within normal limits  GROUP A STREP BY PCR  SARS CORONAVIRUS 2 (TAT 6-24 HRS)     PROCEDURES  Procedure(s) performed (including Critical Care):  Procedures  ____________________________________________   INITIAL IMPRESSION / ASSESSMENT AND PLAN / ED COURSE  As part of my medical decision making, I reviewed the following data within the electronic MEDICAL RECORD NUMBER Notes from prior ED visits and [place] Controlled Substance Database    Brooke Logan [birth year] was evaluated in Emergency Department on 06/20/2019 for the symptoms described in the history of present illness. She was evaluated in the context of the global COVID-19 pandemic, which necessitated consideration that the patient might be at risk for infection with the SARS-CoV-2 virus that causes COVID-19. Institutional protocols and algorithms that pertain to the evaluation of patients at risk for COVID-19 are in a state of rapid change based on information released by regulatory bodies including the CDC and federal and  state organizations. These policies and algorithms were followed during the patient's care in the ED.  24 year old female who is [redacted] weeks pregnant presents to the ED with complaint of headache, cough, body aches for 2 days and believes that she may be having symptoms of Covid versus strep throat.  She also while in the ED began having dysuria.  She states that subjectively she has had a fever.  She also is anxious because she has not felt the baby move.  Fetal heart tones were documented by Doppler as 144 bpm.  Strep test was negative.  Patient was reassured and also is aware that the Covid test will result tomorrow.  She is aware that she needs to quarantine until those results are negative.  She is to follow-up with the [facility] department where she has been seen for her prenatal care.  She is aware that she can only take Tylenol as needed for fever and body aches.  She is encouraged to drink fluids to stay hydrated.  She will  return to the ED if any severe worsening of her symptoms.  ____________________________________________   FINAL CLINICAL IMPRESSION(S) / ED DIAGNOSES  Final diagnoses:  Acute upper respiratory infection  Encounter for screening for COVID-19     ED Discharge Orders    None       Note:  This document was prepared using Dragon voice recognition software and may include unintentional dictation errors.    Tommi Rumps, PA-C 06/20/19 1508    Emily Filbert, MD 06/20/19 [phone]

## 2019-06-20 NOTE — ED Triage Notes (Signed)
Pt in via POV, reports headache, cough, body aches x few days.  States, "I just want to check if I had the Covid."  Pt is [redacted] weeks pregnant.  Vitals WDL, NAD noted at this time.

## 2019-06-20 NOTE — Discharge Instructions (Addendum)
Call if any continued problems with your OB/GYN or the [facility] department.  Your Covid test will result and you may see this results in my chart.  Test in the ED were negative.  You may take Tylenol safely while pregnant but do not take any other anti-inflammatories such as Aleve, Advil, ibuprofen while pregnant.  Crease fluids.

## 2019-06-20 NOTE — ED Triage Notes (Signed)
FIRST NURSE NOTE- spoke with L&D and per brittany RN they would only check FHT via doppler and they want her to be seen in ED.  Pt here for cough, back pain and hard to take deep breath.  Also c/o not feeling baby move anymore and had been feeling movement.

## 2019-06-21 LAB — SARS CORONAVIRUS 2 (TAT 6-24 HRS): SARS Coronavirus 2: NEGATIVE

## 2019-07-17 ENCOUNTER — Ambulatory Visit: Payer: Self-pay

## 2019-08-04 ENCOUNTER — Telehealth: Payer: Self-pay

## 2019-08-04 NOTE — Telephone Encounter (Signed)
Call to client re: f/u appt-reports has transferred care to [facility].  ROI signed for records Sharlette Dense, [employer]

## 2019-09-28 ENCOUNTER — Observation Stay
Admission: EM | Admit: 2019-09-28 | Discharge: 2019-09-28 | Disposition: A | Discharge status: Home / Self Care | Payer: Self-pay | Attending: Obstetrics and Gynecology | Admitting: Obstetrics and Gynecology

## 2019-09-28 ENCOUNTER — Other Ambulatory Visit: Payer: Self-pay

## 2019-09-28 ENCOUNTER — Encounter: Payer: Self-pay | Admitting: Obstetrics and Gynecology

## 2019-09-28 DIAGNOSIS — Z3A38 38 weeks gestation of pregnancy: Secondary | ICD-10-CM | POA: Insufficient documentation

## 2019-09-28 NOTE — Discharge Summary (Signed)
Subjective []Expand by Default  Brooke Logan is a 23 y.o. female. She is at [redacted]w[redacted]d gestation. Patient's last menstrual period was 01/05/2019 (within days). Estimated Date of Delivery: 10/12/19  Prenatal care site:  [facility] Chief complaint:irregular ctx  No LOF , no vaginal Bleeding   starts no problems in pregnancy . Records Not available   S: Resting comfortably. noCTX, no VB.no LOF, Active fetal movement.  Maternal Medical History:       Past Medical History:  Diagnosis Date  . 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 ago  . Dyspnea    Reports feeling SOB @ night & wakes her up  . Headache    Reports Migraine HA's 2x/wk-not had x 2 mths.  . Infection    As a child-developed wound infection, hospitalized 3 mths.-[place]         Past Surgical History:  Procedure Laterality Date  . APPENDECTOMY  2013    No Known Allergies         Prior to Admission medications   Medication Sig Start Date End Date Taking? Authorizing Provider  Prenatal Vit-Fe Fumarate-FA (PRENATAL VITAMIN) 27-0.8 MG TABS Take 1 tablet by mouth daily. 06/19/19  Yes Sciora, Elizabeth A, CNM     Social History: She  reports that she has never smoked. She has never used smokeless tobacco. She reports previous alcohol use. She reports that she does not use drugs.  Family History: family history includes Heart disease in her paternal grandmother. no history of gyn cancers  Review of Systems: A full review of systems was performed and negative except as noted in the HPI.     O:   Objective   BP 118/81 (BP Location: Right Arm)   Pulse 90   Temp 99 F (37.2 C) (Oral)   Resp 17   LMP 01/05/2019 (Within Days) Comment: abnormal No results found for this or any previous visit (from the past 48 hour(s)).   Constitutional: NAD, AAOx3  HE/ENT: extraocular movements grossly intact, moist mucous  membranes CV: RRR PULM: nl respiratory effort, CTABL                                         Abd: gravid, non-tender, non-distended, soft                                                  Ext: Non-tender, Nonedmeatous                     Psych: mood appropriate, speech normal Pelvic 1 /50% ? By RN   NST:  Baseline: 130 Variability: moderate Accelerations present x >2 Decelerations absent  CTX : irregular Time 20mins  Reactive NST   Assessment: 23 y.o. [redacted]w[redacted]d here for antenatal surveillance during pregnancy.  Principle diagnosis: uterine ctx , no labor  Reassuring fetal monitoring   Plan:  Labor: not present.   Fetal Wellbeing: Reassuring Cat 1 tracing.  Reactive NST   D/c home stable, precautions reviewed, follow-up as scheduled.   ----- T Airam Runions MD Attending Obstetrician and Gynecologist [facility], Department of OB/GYN  [facility]           Electronically signed by Florance Paolillo J, MD at 09/28/2019 5:55 PM  

## 2019-09-28 NOTE — Progress Notes (Signed)
Patient ID: Brooke Logan, female   DOB: [birth year], 24 y.o.   MRN: [id number]  JAMELLE NOY Brooke Logan is a 24 y.o. female. She is at [redacted]w[redacted]d gestation. Patient's last menstrual period was 01/05/2019 (within days). Estimated Date of Delivery: 10/12/19  [facility] Chief complaint:irregular ctx  No LOF , no vaginal Bleeding   starts no problems in pregnancy . Records Not available   S: Resting comfortably. no CTX, no VB.no LOF,  Active fetal movement.  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 ago   Dyspnea    Reports feeling SOB @ night & wakes her up   Headache    Reports Migraine HA's 2x/wk-not had x 2 mths.   Infection    As a child-developed wound infection, hospitalized 3 mths.-[place]    Past Surgical History:  Procedure Laterality Date   APPENDECTOMY  2013    No Known Allergies  Prior to Admission medications   Medication Sig Start Date End Date Taking? Authorizing Provider  Prenatal Vit-Fe Fumarate-FA (PRENATAL VITAMIN) 27-0.8 MG TABS Take 1 tablet by mouth daily. 06/19/19  Yes Sciora, Austin Miles, CNM     Social History: She  reports that she has never smoked. She has never used smokeless tobacco. She reports previous alcohol use. She reports that she does not use drugs.  Family History: family history includes Heart disease in her paternal grandmother. no history of gyn cancers  Review of Systems: A full review of systems was performed and negative except as noted in the HPI.     O:  BP 118/81 (BP Location: Right Arm)    Pulse 90    Temp 99 F (37.2 C) (Oral)    Resp 17    LMP 01/05/2019 (Within Days) Comment: abnormal No results found for this or any previous visit (from the past 48 hour(s)).   Constitutional: NAD, AAOx3  HE/ENT: extraocular movements grossly intact, moist mucous membranes CV:  RRR PULM: nl respiratory effort, CTABL     Abd: gravid, non-tender, non-distended, soft      Ext: Non-tender, Nonedmeatous   Psych: mood appropriate, speech normal Pelvic 1 /50% ? By RN   NST:  Baseline: 130 Variability: moderate Accelerations present x >2 Decelerations absent  CTX : irregular Time  Reactive NST   Assessment: 24 y.o. [redacted]w[redacted]d here for antenatal surveillance during pregnancy.  Principle diagnosis: uterine ctx , no labor  Reassuring fetal monitoring   Plan:  Labor: not present.   Fetal Wellbeing: Reassuring Cat 1 tracing.  Reactive NST   D/c home stable, precautions reviewed, follow-up as scheduled.   ----- Beverly Gust MD Attending Obstetrician and Gynecologist [facility], Department of OB/GYN [facility]

## 2019-09-28 NOTE — Discharge Summary (Signed)
Subjective [] Expand by Default  Brooke Logan is a 24 y.o. female. She is at [redacted]w[redacted]d gestation. Patient's last menstrual period was 01/05/2019 (within days). Estimated Date of Delivery: 10/12/19  Prenatal care site:  12/13/19 Chief complaint:irregular ctx  No LOF , no vaginal Bleeding   starts no problems in pregnancy . Records Not available   S: Resting comfortably. noCTX, no VB.no LOF, Active fetal movement.  Maternal Medical History:       Past Medical History:  Diagnosis Date  . 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 ago  . Dyspnea    Reports feeling SOB @ night & wakes her up  . Headache    Reports Migraine HA's 2x/wk-not had x 2 mths.  . Infection    As a child-developed wound infection, hospitalized 3 mths.-[place]         Past Surgical History:  Procedure Laterality Date  . APPENDECTOMY  2013    No Known Allergies         Prior to Admission medications   Medication Sig Start Date End Date Taking? Authorizing Provider  Prenatal Vit-Fe Fumarate-FA (PRENATAL VITAMIN) 27-0.8 MG TABS Take 1 tablet by mouth daily. 06/19/19  Yes Sciora, 08/19/19, CNM     Social History: She  reports that she has never smoked. She has never used smokeless tobacco. She reports previous alcohol use. She reports that she does not use drugs.  Family History: family history includes Heart disease in her paternal grandmother. no history of gyn cancers  Review of Systems: A full review of systems was performed and negative except as noted in the HPI.     O:   Objective   BP 118/81 (BP Location: Right Arm)   Pulse 90   Temp 99 F (37.2 C) (Oral)   Resp 17   LMP 01/05/2019 (Within Days) Comment: abnormal No results found for this or any previous visit (from the past 48 hour(s)).   Constitutional: NAD, AAOx3  HE/ENT: extraocular movements grossly intact, moist mucous  membranes CV: RRR PULM: nl respiratory effort, CTABL                                         Abd: gravid, non-tender, non-distended, soft                                                  Ext: Non-tender, Nonedmeatous                     Psych: mood appropriate, speech normal Pelvic 1 /50% ? By RN   NST:  Baseline: 130 Variability: moderate Accelerations present x >2 Decelerations absent  CTX : irregular Time 01/07/2019  Reactive NST   Assessment: 24 y.o. [redacted]w[redacted]d here for antenatal surveillance during pregnancy.  Principle diagnosis: uterine ctx , no labor  Reassuring fetal monitoring   Plan:  Labor: not present.   Fetal Wellbeing: Reassuring Cat 1 tracing.  Reactive NST   D/c home stable, precautions reviewed, follow-up as scheduled.   ----- [redacted]w[redacted]d MD Attending Obstetrician and Gynecologist [facility], Department of OB/GYN [facility]  Electronically signed by Boykin Nearing, MD at 09/28/2019 5:55 PM

## 2019-10-09 ENCOUNTER — Observation Stay
Admission: EM | Admit: 2019-10-09 | Discharge: 2019-10-09 | Disposition: A | Discharge status: Home / Self Care | Payer: Self-pay | Attending: Certified Nurse Midwife | Admitting: Certified Nurse Midwife

## 2019-10-09 ENCOUNTER — Other Ambulatory Visit: Payer: Self-pay

## 2019-10-09 ENCOUNTER — Encounter: Payer: Self-pay | Admitting: Obstetrics and Gynecology

## 2019-10-09 DIAGNOSIS — O471 False labor at or after 37 completed weeks of gestation: Principal | ICD-10-CM | POA: Insufficient documentation

## 2019-10-09 DIAGNOSIS — B9689 Other specified bacterial agents as the cause of diseases classified elsewhere: Secondary | ICD-10-CM | POA: Diagnosis present

## 2019-10-09 DIAGNOSIS — O479 False labor, unspecified: Secondary | ICD-10-CM | POA: Diagnosis present

## 2019-10-09 DIAGNOSIS — Z3A39 39 weeks gestation of pregnancy: Secondary | ICD-10-CM | POA: Insufficient documentation

## 2019-10-09 LAB — WET PREP, GENITAL
Sperm: NONE SEEN
Trich, Wet Prep: NONE SEEN
Yeast Wet Prep HPF POC: NONE SEEN

## 2019-10-09 MED ORDER — METRONIDAZOLE 500 MG PO TABS
500.0000 mg | ORAL_TABLET | Freq: Two times a day (BID) | ORAL | 0 refills | Status: DC
Start: 2019-10-09 — End: 2019-10-14

## 2019-10-09 NOTE — Discharge Instructions (Signed)
First Stage of Labor °Labor is your body's natural process of moving your baby and other structures, including the placenta and umbilical cord, out of your uterus. There are three stages of labor. How long each stage lasts is different for every woman. But certain events happen during each stage that are the same for everyone. °· The first stage starts when true labor begins. This stage ends when your cervix, which is the opening from your uterus into your vagina, is completely open (dilated). °· The second stage begins when your cervix is fully dilated and you start pushing. This stage ends when your baby is born. °· The third stage is the delivery of the organ that nourished your baby during pregnancy (placenta). °First stage of labor °As your due date gets closer, you may start to notice certain physical changes that mean labor is going to start soon. You may feel that your baby has dropped lower into your pelvis. You may experience irregular, often painless, contractions that go away when you walk around or lie down (Braxton Hicks contractions). This is also called false labor. °The first stage of labor begins when you start having contractions that come at regular (evenly spaced) intervals and your cervix starts to get thinner and wider in preparation for your baby to pass through. Birth care providers measure the dilation of your cervix in centimeters (cm). One centimeter is a little less than one-half of an inch. The first stage ends when your cervix is dilated to 10 cm. The first stage of labor is divided into three phases: °· Early phase. °· Active phase. °· Transitional phase. °The length of the first stage of labor varies. It may be longer if this is your first pregnancy. You may spend most of this stage at home trying to relax and stay comfortable. °How does this affect me? °During the first stage of labor, you will move through three phases. °What happens in the early phase? °· You will start to have  regular contractions that last 30-60 seconds. Contractions may come every 5-20 minutes. Keep track of your contractions and call your birth care provider. °· Your water may break during this phase. °· You may notice a clear or slightly bloody discharge of mucus (mucus plug) from your vagina. °· Your cervix will dilate to 3-6 cm. °What happens in the active phase? °The active phase usually lasts 3-5 hours. You may go to the hospital or birth center around this time. During the active phase: °· Your contractions will become stronger, longer, and more uncomfortable. °· Your contractions may last 45-90 seconds and come every 3-5 minutes. °· You may feel lower back pain. °· Your birth care providers may examine your cervix and feel your belly to find the position of your baby. °· You may have a monitor strapped to your belly to measure your contractions and your baby's heart rate. °· You may start using your pain management options. °· Your cervix may be dilated to 6 cm and may start to dilate more quickly. °What happens in the transitional phase? °The transitional phase typically lasts from 30 minutes to 2 hours. At the end of this phase, your cervix will be fully dilated to 10 cm. During the transitional phase: °· Contractions will get stronger and longer. °· Contractions may last 60-90 seconds and come less than 2 minutes apart. °· You may feel hot flashes, chills, or nausea. °How does this affect my baby? °During the first stage of labor, your baby will   gradually move down into your birth canal. °Follow these instructions at home and in the hospital or birth center: ° °· When labor first begins, try to stay calm. You are still in the early phase. If it is night, try to get some sleep. If it is day, try to relax and save your energy. You may want to make some calls and get ready to go to the hospital or birth center. °· When you are in the early phase, try these methods to help ease discomfort: °? Deep breathing and  muscle relaxation. °? Taking a walk. °? Taking a warm bath or shower. °· Drink some fluids and have a light snack if you feel like it. °· Keep track of your contractions. °· Based on the plan you created with your birth care provider, call when your contractions indicate it is time. °· If your water breaks, note the time, color, and odor of the fluid. °· When you are in the active phase, do your breathing exercises and rely on your support people and your team of birth care providers. °Contact a health care provider if: °· Your contractions are strong and regular. °· You have lower back pain or cramping. °· Your water breaks. °· You lose your mucus plug. °Get help right away if you: °· Have a severe headache that does not go away. °· Have changes in your vision. °· Have severe pain in your upper belly. °· Do not feel the baby move. °· Have bright red bleeding. °Summary °· The first stage of labor starts when true labor begins, and it ends when your cervix is dilated to 10 cm. °· The first stage of labor has three phases: early, active, and transitional. °· Your baby moves into the birth canal during the first stage of labor. °· You may have contractions that become stronger and longer. You may also lose your mucus plug and have your water break. °· Call your birth care provider when your contractions are frequent and strong enough to go to the hospital or birth center. °This information is not intended to replace advice given to you by your health care provider. Make sure you discuss any questions you have with your health care provider. °Document Revised: 07/21/2018 Document Reviewed: 06/13/2017 °Elsevier Patient Education © 2020 [employer] ° °

## 2019-10-09 NOTE — Progress Notes (Signed)
Monitors removed. Pt is currently walking.

## 2019-10-09 NOTE — Discharge Summary (Signed)
Patient ID: Brooke Logan MRN: [id number] DOB/AGE: 04/24/1995 24 y.o.  Admit date: 10/09/2019 Discharge date: 10/10/2019  Admission Diagnoses: Contractions  Discharge Diagnoses: Wet Prep + for BV;  Contractions stopped  Prenatal Procedures: NST and Wet prep, hydration  Consults: None  Significant Diagnostic Studies:  Results for orders placed or performed during the hospital encounter of 10/09/19 (from the past 168 hour(s))  Wet prep, genital   Collection Time: 10/09/19  6:07 PM   Specimen: Vaginal  Result Value Ref Range   Yeast Wet Prep HPF POC NONE SEEN NONE SEEN   Trich, Wet Prep NONE SEEN NONE SEEN   Clue Cells Wet Prep HPF POC PRESENT (A) NONE SEEN   WBC, Wet Prep HPF POC FEW (A) NONE SEEN   Sperm NONE SEEN     Treatments: antibiotics: metronidazole  Hospital Course:  This is a 24 y.o. G1P0000 with IUP at [redacted]w[redacted]d admitted for contractions, noted to have a cervical exam of 1-2/70/-1.  No leaking of fluid and no bleeding.  Contractions resolved. She was observed, fetal heart rate monitoring remained reassuring, and she had no signs/symptoms of progressing labor or other maternal-fetal concerns.  Her cervical exam was unchanged from admission.  She was deemed stable for discharge to home with outpatient follow up.  Discharge Physical Exam:  BP 136/81 (BP Location: Left Arm)   Pulse 100   Temp 98.4 F (36.9 C) (Oral)   Resp 14   Ht 5\' 5"  (1.651 m)   Wt 86.2 kg   LMP 01/05/2019 (Within Days) Comment: abnormal  BMI 31.62 kg/m   General: NAD CV: RRR Pulm: CTABL, nl effort ABD: s/nd/nt, gravid DVT Evaluation: LE non-ttp, no evidence of DVT on exam.  NST: FHR baseline: 125 bpm Variability: moderate Accelerations: yes Decelerations: none Time: 45 minutes Category/reactivity: reactive  TOCO: rare    Discharge Condition: Stable  Disposition: Discharge disposition: 01-Home or Self Care        Allergies as of 10/09/2019   No Known Allergies      Medication List    TAKE these medications   metroNIDAZOLE 500 MG tablet Commonly known as: Flagyl Take 1 tablet (500 mg total) by mouth 2 (two) times daily for 7 days.   Prenatal Vitamin 27-0.8 MG Tabs Take 1 tablet by mouth daily.       Follow-up Information    Center, [facility]. Go in 2 day(s).   Specialty: General Practice Contact information: [address] [place] [phone]               Signed:  [phone], CNM 10/10/2019 10:14 AM

## 2019-10-09 NOTE — OB Triage Note (Signed)
Pt is a 23yo G1P0 at [redacted]w[redacted]d that presents from the ED with c/o Ctx that started around 1300 today. Pt states ctx are irregular. Pt states positive FM and denies VB. Pt states she has had increased discharge today. EFM applied and initial FHT 125.

## 2019-10-10 ENCOUNTER — Other Ambulatory Visit: Payer: Self-pay | Admitting: Certified Nurse Midwife

## 2019-10-10 NOTE — Progress Notes (Signed)
  Brooke Logan is a 24 y.o. G1P0000 female dated by LMP c/w [redacted]w[redacted]d ultrasound.    Pregnancy Issues: 1. Prepregnancy BMI 30.7   Prenatal care site: [facility], then transferred to [facility]   Prenatal Labs: Blood type/Rh O+  Antibody screen neg  Rubella pending  Varicella pending  RPR NR  HBsAg Neg  HIV NR  GC neg  Chlamydia neg  Genetic screening MaterniT21 negative, XY  1 hour GTT 78  3 hour GTT n/a  GBS negative    Post Partum Planning: - Infant feeding: breast - Contraception: OCPs  Tdap: 08/02/2019

## 2019-10-11 ENCOUNTER — Encounter: Payer: Self-pay | Admitting: Obstetrics and Gynecology

## 2019-10-11 ENCOUNTER — Other Ambulatory Visit: Payer: Self-pay

## 2019-10-11 ENCOUNTER — Observation Stay
Admission: EM | Admit: 2019-10-11 | Discharge: 2019-10-11 | Disposition: A | Discharge status: Home / Self Care | Payer: Self-pay

## 2019-10-11 DIAGNOSIS — O368190 Decreased fetal movements, unspecified trimester, not applicable or unspecified: Secondary | ICD-10-CM | POA: Diagnosis present

## 2019-10-11 DIAGNOSIS — Z8249 Family history of ischemic heart disease and other diseases of the circulatory system: Secondary | ICD-10-CM | POA: Insufficient documentation

## 2019-10-11 DIAGNOSIS — J45909 Unspecified asthma, uncomplicated: Secondary | ICD-10-CM | POA: Insufficient documentation

## 2019-10-11 DIAGNOSIS — O99513 Diseases of the respiratory system complicating pregnancy, third trimester: Secondary | ICD-10-CM | POA: Insufficient documentation

## 2019-10-11 DIAGNOSIS — Z3A39 39 weeks gestation of pregnancy: Secondary | ICD-10-CM | POA: Insufficient documentation

## 2019-10-11 DIAGNOSIS — B9689 Other specified bacterial agents as the cause of diseases classified elsewhere: Secondary | ICD-10-CM

## 2019-10-11 DIAGNOSIS — O368130 Decreased fetal movements, third trimester, not applicable or unspecified: Principal | ICD-10-CM | POA: Insufficient documentation

## 2019-10-11 MED ORDER — CALCIUM CARBONATE ANTACID 500 MG PO CHEW: 2.0000 | CHEWABLE_TABLET | ORAL | Status: DC | PRN

## 2019-10-11 MED ORDER — ACETAMINOPHEN 325 MG PO TABS: 650.0000 mg | ORAL_TABLET | ORAL | Status: DC | PRN

## 2019-10-11 NOTE — Discharge Summary (Signed)
Brooke Logan is a 24 y.o. female. She is at [redacted]w[redacted]d gestation. Patient's last menstrual period was 01/05/2019 (within days). Estimated Date of Delivery: 10/12/19  Prenatal care site: [facility]   Chief complaint: Decreased fetal movement   Sent from [facility] with complaints of decreased fetal movement and NR NST in office.  Reports baby has been moving every day but movement felt different.   S: Resting comfortably. no CTX, no VB.no LOF.  Reports feeling fetal movement.   Maternal Medical History:  Past Medical Hx:  has a past medical history of Asthma, Dyspnea, Headache, and Infection.    Past Surgical Hx:  has a past surgical history that includes Appendectomy (2013).   No Known Allergies   Prior to Admission medications   Medication Sig Start Date End Date Taking? Authorizing Provider  metroNIDAZOLE (FLAGYL) 500 MG tablet Take 1 tablet (500 mg total) by mouth 2 (two) times daily for 7 days. 10/09/19 10/16/19 Yes Haroldine Laws, CNM  Prenatal Vit-Fe Fumarate-FA (PRENATAL VITAMIN) 27-0.8 MG TABS Take 1 tablet by mouth daily. 06/19/19  Yes Sciora, Austin Miles, CNM    Social History: She  reports that she has never smoked. She has never used smokeless tobacco. She reports previous alcohol use. She reports that she does not use drugs.  Family History: family history includes Heart disease in her paternal grandmother.  Review of Systems: A full review of systems was performed and negative except as noted in the HPI.    O:  LMP 01/05/2019 (Within Days) Comment: abnormal Results for orders placed or performed during the hospital encounter of 10/09/19 (from the past 48 hour(s))  Wet prep, genital   Collection Time: 10/09/19  6:07 PM   Specimen: Vaginal  Result Value Ref Range   Yeast Wet Prep HPF POC NONE SEEN NONE SEEN   Trich, Wet Prep NONE SEEN NONE SEEN   Clue Cells Wet Prep HPF POC PRESENT (A) NONE SEEN   WBC, Wet Prep HPF POC FEW (A) NONE SEEN   Sperm NONE SEEN       Constitutional: NAD, AAOx3 CV: RRR PULM: nl respiratory effort, CTABL     Abd: gravid, non-tender, non-distended, soft      Ext: Non-tender, Nonedmeatous   Psych: mood appropriate, speech normal Pelvic deferred  NST:  Baseline: 135bpm Variability: moderate Accelerations present x >2 Decelerations absent Time 30 min   Assessment: 24 y.o. [redacted]w[redacted]d here for antenatal surveillance during pregnancy.  Principle diagnosis:   Plan:  Labor: not present.   Fetal Wellbeing: Reassuring Cat 1 tracing.  Reactive NST   Reviewed kick counts - return for any concerns for fetal movement.   IOL scheduled for 10/19/2019 at Midnight.    D/c home stable, precautions reviewed, follow-up this Friday.   ----- Margaretmary Eddy, CNM Certified Nurse Midwife [facility]

## 2019-10-11 NOTE — Discharge Summary (Signed)
Patient discharged home, discharge instructions given, patient states understanding. Patient left floor in stable condition, denies any other needs at this time. Patient to schedule appointment for Friday. IOL scheduled, pt informed of plan.

## 2019-10-11 NOTE — OB Triage Note (Signed)
Patient sent over from clinic for decreased fetal movement time 2 days. nonreactive NST in office. Pt states baby is moving, just not as often as normal and had to drink orange juice yesterday. Denies LOF, vaginal bleeding, or any other concerns.

## 2019-10-11 NOTE — Discharge Instructions (Signed)
Please make a follow up OB appointment at the Clinic for Friday. Call your provider for any other concerns. You are schedule for an Induction of Labor on 7/8/2021at midnight,

## 2019-10-12 ENCOUNTER — Other Ambulatory Visit: Payer: Self-pay

## 2019-10-12 ENCOUNTER — Inpatient Hospital Stay
Admission: EM | Admit: 2019-10-12 | Discharge: 2019-10-14 | DRG: 806 | Disposition: A | Discharge status: Home / Self Care | Payer: Medicaid Other | Attending: Certified Nurse Midwife | Admitting: Certified Nurse Midwife

## 2019-10-12 ENCOUNTER — Inpatient Hospital Stay: Payer: Medicaid Other | Admitting: Anesthesiology

## 2019-10-12 ENCOUNTER — Encounter: Payer: Self-pay | Admitting: Obstetrics and Gynecology

## 2019-10-12 DIAGNOSIS — O26893 Other specified pregnancy related conditions, third trimester: Secondary | ICD-10-CM | POA: Diagnosis present

## 2019-10-12 DIAGNOSIS — B9689 Other specified bacterial agents as the cause of diseases classified elsewhere: Secondary | ICD-10-CM

## 2019-10-12 DIAGNOSIS — O479 False labor, unspecified: Secondary | ICD-10-CM | POA: Diagnosis present

## 2019-10-12 DIAGNOSIS — Z20822 Contact with and (suspected) exposure to covid-19: Secondary | ICD-10-CM | POA: Diagnosis present

## 2019-10-12 DIAGNOSIS — Z3A40 40 weeks gestation of pregnancy: Secondary | ICD-10-CM

## 2019-10-12 LAB — COMPREHENSIVE METABOLIC PANEL
ALT: 16 U/L (ref 0–44)
AST: 23 U/L (ref 15–41)
Albumin: 3.2 g/dL — ABNORMAL LOW (ref 3.5–5.0)
Alkaline Phosphatase: 115 U/L (ref 38–126)
Anion gap: 11 (ref 5–15)
BUN: 13 mg/dL (ref 6–20)
CO2: 20 mmol/L — ABNORMAL LOW (ref 22–32)
Calcium: 9.5 mg/dL (ref 8.9–10.3)
Chloride: 103 mmol/L (ref 98–111)
Creatinine, Ser: 0.66 mg/dL (ref 0.44–1.00)
GFR calc Af Amer: >60 mL/min (ref >60)
GFR calc non Af Amer: >60 mL/min (ref >60)
Glucose, Bld: 91 mg/dL (ref 70–99)
Potassium: 3.6 mmol/L (ref 3.5–5.1)
Sodium: 134 mmol/L — ABNORMAL LOW (ref 135–145)
Total Bilirubin: 0.7 mg/dL (ref 0.3–1.2)
Total Protein: 7.1 g/dL (ref 6.5–8.1)

## 2019-10-12 LAB — RUPTURE OF MEMBRANE (ROM)PLUS: Rom Plus: POSITIVE

## 2019-10-12 LAB — TYPE AND SCREEN
ABO/RH(D): O POS
Antibody Screen: NEGATIVE

## 2019-10-12 LAB — CBC
HCT: 40.4 % (ref 36.0–46.0)
Hemoglobin: 13.8 g/dL (ref 12.0–15.0)
MCH: 29.4 pg (ref 26.0–34.0)
MCHC: 34.2 g/dL (ref 30.0–36.0)
MCV: 86.1 fL (ref 80.0–100.0)
Platelets: 264 10*3/uL (ref 150–400)
RBC: 4.69 MIL/uL (ref 3.87–5.11)
RDW: 13 % (ref 11.5–15.5)
WBC: 6.8 10*3/uL (ref 4.0–10.5)
nRBC: 0 % (ref 0.0–0.2)

## 2019-10-12 LAB — PROTEIN / CREATININE RATIO, URINE
Creatinine, Urine: 94 mg/dL
Protein Creatinine Ratio: 0.21 mg/mg{Cre} — ABNORMAL HIGH (ref 0.00–0.15)
Total Protein, Urine: 20 mg/dL

## 2019-10-12 LAB — SARS CORONAVIRUS 2 BY RT PCR (HOSPITAL ORDER, PERFORMED IN ~~LOC~~ HOSPITAL LAB): SARS Coronavirus 2: NEGATIVE

## 2019-10-12 LAB — ABO/RH: ABO/RH(D): O POS

## 2019-10-12 MED ORDER — OXYTOCIN BOLUS FROM INFUSION
333.0000 mL | Freq: Once | INTRAVENOUS | Status: AC
  Administered 2019-10-12: 333 mL via INTRAVENOUS

## 2019-10-12 MED ORDER — SIMETHICONE 80 MG PO CHEW: 80.0000 mg | CHEWABLE_TABLET | ORAL | Status: DC | PRN

## 2019-10-12 MED ORDER — ACETAMINOPHEN 325 MG PO TABS
650.0000 mg | ORAL_TABLET | ORAL | Status: DC | PRN
  Administered 2019-10-12 (×2): 650 mg via ORAL
  Filled 2019-10-12 (×2): qty 2

## 2019-10-12 MED ORDER — IBUPROFEN 600 MG PO TABS
600.0000 mg | ORAL_TABLET | Freq: Four times a day (QID) | ORAL | Status: DC
  Administered 2019-10-12 – 2019-10-13 (×2): 600 mg via ORAL
  Filled 2019-10-12 (×2): qty 1

## 2019-10-12 MED ORDER — BENZOCAINE-MENTHOL 20-0.5 % EX AERO
1.0000 "application " | INHALATION_SPRAY | CUTANEOUS | Status: DC | PRN
  Administered 2019-10-12: 1 via TOPICAL
  Filled 2019-10-12 (×2): qty 56

## 2019-10-12 MED ORDER — DIPHENHYDRAMINE HCL 25 MG PO CAPS: 25.0000 mg | ORAL_CAPSULE | Freq: Four times a day (QID) | ORAL | Status: DC | PRN

## 2019-10-12 MED ORDER — LIDOCAINE-EPINEPHRINE (PF) 1.5 %-1:200000 IJ SOLN
INTRAMUSCULAR | Status: DC | PRN
  Administered 2019-10-12: 3 mL via PERINEURAL

## 2019-10-12 MED ORDER — MISOPROSTOL 200 MCG PO TABS
ORAL_TABLET | ORAL | Status: AC
  Administered 2019-10-12: 800 ug
  Filled 2019-10-12: qty 4

## 2019-10-12 MED ORDER — OXYTOCIN-SODIUM CHLORIDE 30-0.9 UT/500ML-% IV SOLN
1.0000 m[IU]/min | INTRAVENOUS | Status: DC
  Administered 2019-10-12: 2 m[IU]/min via INTRAVENOUS

## 2019-10-12 MED ORDER — MISOPROSTOL 200 MCG PO TABS: 800.0000 ug | ORAL_TABLET | Freq: Once | ORAL | Status: AC

## 2019-10-12 MED ORDER — DIPHENHYDRAMINE HCL 50 MG/ML IJ SOLN: 12.5000 mg | INTRAMUSCULAR | Status: DC | PRN

## 2019-10-12 MED ORDER — TETANUS-DIPHTH-ACELL PERTUSSIS 5-2.5-18.5 LF-MCG/0.5 IM SUSP: 0.5000 mL | Freq: Once | INTRAMUSCULAR | Status: DC

## 2019-10-12 MED ORDER — FENTANYL 2.5 MCG/ML W/ROPIVACAINE 0.15% IN NS 100 ML EPIDURAL (ARMC)
12.0000 mL/h | EPIDURAL | Status: DC
  Administered 2019-10-12 (×2): 12 mL/h via EPIDURAL
  Filled 2019-10-12: qty 100

## 2019-10-12 MED ORDER — OXYTOCIN-SODIUM CHLORIDE 30-0.9 UT/500ML-% IV SOLN
2.5000 [IU]/h | INTRAVENOUS | Status: DC
  Administered 2019-10-12: 2.5 [IU]/h via INTRAVENOUS
  Filled 2019-10-12 (×2): qty 500

## 2019-10-12 MED ORDER — LACTATED RINGERS IV SOLN
500.0000 mL | Freq: Once | INTRAVENOUS | Status: AC
  Administered 2019-10-12: 500 mL via INTRAVENOUS

## 2019-10-12 MED ORDER — COCONUT OIL OIL
1.0000 "application " | TOPICAL_OIL | Status: DC | PRN
  Filled 2019-10-12 (×2): qty 120

## 2019-10-12 MED ORDER — EPHEDRINE 5 MG/ML INJ: 10.0000 mg | INTRAVENOUS | Status: DC | PRN

## 2019-10-12 MED ORDER — ONDANSETRON HCL 4 MG/2ML IJ SOLN: 4.0000 mg | INTRAMUSCULAR | Status: DC | PRN

## 2019-10-12 MED ORDER — PRENATAL MULTIVITAMIN CH
1.0000 | ORAL_TABLET | Freq: Every day | ORAL | Status: DC
  Administered 2019-10-13: 1 via ORAL
  Filled 2019-10-12: qty 1

## 2019-10-12 MED ORDER — AMMONIA AROMATIC IN INHA
RESPIRATORY_TRACT | Status: AC
  Filled 2019-10-12: qty 10

## 2019-10-12 MED ORDER — LACTATED RINGERS IV SOLN: INTRAVENOUS | Status: DC

## 2019-10-12 MED ORDER — PHENYLEPHRINE 40 MCG/ML (10ML) SYRINGE FOR IV PUSH (FOR BLOOD PRESSURE SUPPORT)
80.0000 ug | PREFILLED_SYRINGE | INTRAVENOUS | Status: DC | PRN
  Filled 2019-10-12: qty 10

## 2019-10-12 MED ORDER — LACTATED RINGERS IV SOLN
500.0000 mL | INTRAVENOUS | Status: DC | PRN
  Administered 2019-10-12: 500 mL via INTRAVENOUS

## 2019-10-12 MED ORDER — ONDANSETRON HCL 4 MG PO TABS: 4.0000 mg | ORAL_TABLET | ORAL | Status: DC | PRN

## 2019-10-12 MED ORDER — OXYTOCIN 10 UNIT/ML IJ SOLN
INTRAMUSCULAR | Status: AC
  Filled 2019-10-12: qty 2

## 2019-10-12 MED ORDER — DOCUSATE SODIUM 100 MG PO CAPS
100.0000 mg | ORAL_CAPSULE | Freq: Two times a day (BID) | ORAL | Status: DC
  Administered 2019-10-13 – 2019-10-14 (×3): 100 mg via ORAL
  Filled 2019-10-12 (×3): qty 1

## 2019-10-12 MED ORDER — FENTANYL CITRATE (PF) 100 MCG/2ML IJ SOLN: 50.0000 ug | INTRAMUSCULAR | Status: DC | PRN

## 2019-10-12 MED ORDER — LIDOCAINE HCL (PF) 1 % IJ SOLN
INTRAMUSCULAR | Status: AC
  Filled 2019-10-12: qty 30

## 2019-10-12 MED ORDER — ONDANSETRON HCL 4 MG/2ML IJ SOLN: 4.0000 mg | Freq: Four times a day (QID) | INTRAMUSCULAR | Status: DC | PRN

## 2019-10-12 MED ORDER — SOD CITRATE-CITRIC ACID 500-334 MG/5ML PO SOLN: 30.0000 mL | ORAL | Status: DC | PRN

## 2019-10-12 MED ORDER — BUPIVACAINE HCL (PF) 0.25 % IJ SOLN
INTRAMUSCULAR | Status: DC | PRN
  Administered 2019-10-12 (×2): 5 mL via EPIDURAL

## 2019-10-12 MED ORDER — WITCH HAZEL-GLYCERIN EX PADS
1.0000 "application " | MEDICATED_PAD | CUTANEOUS | Status: DC | PRN
  Filled 2019-10-12 (×4): qty 100

## 2019-10-12 MED ORDER — LIDOCAINE HCL (PF) 1 % IJ SOLN
30.0000 mL | INTRAMUSCULAR | Status: AC | PRN
  Administered 2019-10-12: 30 mL via SUBCUTANEOUS

## 2019-10-12 MED ORDER — FENTANYL 2.5 MCG/ML W/ROPIVACAINE 0.15% IN NS 100 ML EPIDURAL (ARMC)
EPIDURAL | Status: AC
  Filled 2019-10-12: qty 100

## 2019-10-12 MED ORDER — ACETAMINOPHEN 325 MG PO TABS
650.0000 mg | ORAL_TABLET | ORAL | Status: DC | PRN
  Administered 2019-10-13 (×3): 650 mg via ORAL
  Filled 2019-10-12 (×3): qty 2

## 2019-10-12 MED ORDER — OXYCODONE HCL 5 MG PO TABS
5.0000 mg | ORAL_TABLET | ORAL | Status: DC | PRN
  Administered 2019-10-13 (×2): 5 mg via ORAL
  Filled 2019-10-12 (×2): qty 1

## 2019-10-12 MED ORDER — DIBUCAINE (PERIANAL) 1 % EX OINT
1.0000 "application " | TOPICAL_OINTMENT | CUTANEOUS | Status: DC | PRN
  Filled 2019-10-12 (×3): qty 28

## 2019-10-12 MED ORDER — OXYCODONE-ACETAMINOPHEN 5-325 MG PO TABS: 2.0000 | ORAL_TABLET | ORAL | Status: DC | PRN

## 2019-10-12 MED ORDER — OXYCODONE HCL 5 MG PO TABS: 10.0000 mg | ORAL_TABLET | ORAL | Status: DC | PRN

## 2019-10-12 MED ORDER — LIDOCAINE HCL (PF) 1 % IJ SOLN
INTRAMUSCULAR | Status: DC | PRN
  Administered 2019-10-12: 3 mL

## 2019-10-12 MED ORDER — OXYCODONE-ACETAMINOPHEN 5-325 MG PO TABS: 1.0000 | ORAL_TABLET | ORAL | Status: DC | PRN

## 2019-10-12 NOTE — Anesthesia Procedure Notes (Signed)
Epidural Patient location during procedure: OB Start time: 10/12/2019 11:12 AM End time: 10/12/2019 11:42 AM  Staffing Resident/CRNA: Junious Silk, CRNA Performed: resident/CRNA   Preanesthetic Checklist Completed: patient identified, IV checked, site marked, risks and benefits discussed, surgical consent, monitors and equipment checked, pre-op evaluation and timeout performed  Epidural Patient position: sitting Prep: Betadine Patient monitoring: heart rate, continuous pulse ox and blood pressure Approach: midline Location: L3-L4 Injection technique: LOR saline  Needle:  Needle type: Tuohy  Needle gauge: 17 G Needle length: 9 cm and 9 Catheter type: closed end flexible Catheter size: 20 Guage Test dose: negative and 1.5% lidocaine with Epi 1:200 K  Assessment Sensory level: T10 Events: blood not aspirated, injection not painful, no injection resistance, no paresthesia and negative IV test  Additional Notes   Patient tolerated the insertion well without complications.Reason for block:procedure for pain

## 2019-10-12 NOTE — OB Triage Note (Signed)
Pt. Presented to L/D triage with reported SROM at 0730. She reports the fluid as small and clear but felt a "pop". Her contractions, rated 7/10, have increased in intensity and frequency since. No bleeding and positive fetal movement stated. VSS. Pt. States she has started her Flagyl but did not take this AM. Monitors applied and assessing.

## 2019-10-12 NOTE — Progress Notes (Signed)
Labor Progress Note  Brooke Logan is a 24 y.o. G1P0000 at [redacted]w[redacted]d by LMP admitted for active labor and SROM  Subjective: Comfortable with epidural  Objective: BP 103/74   Pulse (!) 124   Temp 98.2 F (36.8 C) (Oral)   Resp 20   Ht 5\' 4"  (1.626 m)   Wt 86.6 kg   LMP 01/05/2019 (Within Days) Comment: abnormal  BMI 32.79 kg/m   Fetal Assessment: FHT:  FHR: 145 bpm, variability: moderate,  accelerations:  Present,  decelerations:  Absent Category/reactivity:  Category I UC:   regular, every 3 minutes SVE:    Dilation: 7cm  Effacement: 100%  Station:  0  Consistency: soft  Position: anterior  Membrane status: SROM at 0730 Amniotic color: Clear  Vitals with BMI 10/12/2019 10/12/2019 10/12/2019  Height - - -  Weight - - -  BMI - - -  Systolic 103 99 131  Diastolic 74 61 84  Pulse 124 126 97    Assessment / Plan: Spontaneous labor, progressing normally  Labor: Progressing normally Preeclampsia:  Labs WNL, BPs above  Fetal Wellbeing:  Category I Pain Control:  Epidural I/D:  Afebrile, GBS neg, SROM x 9 hours Anticipated MOD:  NSVD  Jenifer E Doriann Zuch, CNM 10/12/2019, 5:06 PM

## 2019-10-12 NOTE — Progress Notes (Signed)
CNM notified of fetal HR baseline change and transient tachycardia along with maternal tachycardia. Last maternal temp 99.4.CNM to come evaluate at bedside.

## 2019-10-12 NOTE — H&P (Signed)
OB History & Physical   History of Present Illness:  Chief Complaint:   HPI:  Brooke Logan is a 24 y.o. G1P0000 female at [redacted]w[redacted]d dated by LMP.  She presents to L&D for Uterine contractions and SROM  She reports:  -active fetal movement -LOF/SROM at 0730 -no vaginal bleeding -onset of contractions at 0745 currently every 4 minutes  Pregnancy Issues: 1. Prepregnancy BMI 30.7   Maternal Medical History:   Past Medical History:  Diagnosis Date  . 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 ago  . Dyspnea    Reports feeling SOB @ night & wakes her up  . Headache    Reports Migraine HA's 2x/wk-not had x 2 mths.  . Infection    As a child-developed wound infection, hospitalized 3 mths.-[place]    Past Surgical History:  Procedure Laterality Date  . APPENDECTOMY  2013    No Known Allergies  Prior to Admission medications   Medication Sig Start Date End Date Taking? Authorizing Provider  metroNIDAZOLE (FLAGYL) 500 MG tablet Take 1 tablet (500 mg total) by mouth 2 (two) times daily for 7 days. 10/09/19 10/16/19 Yes Haroldine Laws, CNM  Prenatal Vit-Fe Fumarate-FA (PRENATAL VITAMIN) 27-0.8 MG TABS Take 1 tablet by mouth daily. 06/19/19  Yes Sciora, Austin Miles, CNM     [facility] care site: [facility]  Social History: She  reports that she has never smoked. She has never used smokeless tobacco. She reports previous alcohol use. She reports that she does not use drugs.  Family History: family history includes Heart disease in her paternal grandmother.   Review of Systems: A full review of systems was performed and negative except as noted in the HPI.    Physical Exam:  Vital Signs: BP 138/84   Pulse 94   Temp 98.5 F (36.9 C) (Oral)   Resp 20   Ht 5\' 4"  (1.626 m)   Wt 86.6 kg   LMP 01/05/2019 (Within Days) Comment: abnormal  BMI 32.79 kg/m   General:   alert and cooperative  Skin:  normal   Neurologic:    Alert & oriented x 3  Lungs:   nl effort  Heart:   regular rate and rhythm  Abdomen:  normal findings: soft, non-tender between contractions  Extremities: : non-tender, symmetric      Pertinent Results:  Prenatal Labs: Blood type/Rh O+  Antibody screen neg  Rubella pending  Varicella pending  RPR NR  HBsAg Neg  HIV NR  GC neg  Chlamydia neg  Genetic screening MaterniT21 negative, XY  1 hour GTT 78  3 hour GTT n/a  GBS negative    FHT: FHR: 140 bpm, variability: moderate,  accelerations:  Present,  decelerations:  Absent Category/reactivity:  Category I TOCO: regular, every 4 minutes SVE: Dilation: 3.5 / Effacement (%): 70 / Station: -2    Cephalic by SVE  Results for orders placed or performed during the hospital encounter of 10/12/19 (from the past 24 hour(s))  ROM Plus ([facility] only)     Status: None   Collection Time: 10/12/19  9:13 AM  Result Value Ref Range   Rom Plus POSITIVE     Assessment:  Brooke Logan is a 24 y.o. G1P0000 female at [redacted]w[redacted]d with contractions and SROM.   Plan:  1. Admit to Labor & Delivery; consents reviewed and obtained  2. Fetal Well being  - Fetal Tracing:  Cat I - GBS neg - Presentation: Vtx confirmed by SVE   3. Routine OB: - Prenatal labs reviewed, as above - Rh pos - CBC & T&S on admit - Clear fluids, IVF  4. Monitoring of Labor -  Contractions external toco in place -  Plan for continuous fetal monitoring  -  Maternal pain control as desired: IVPM, regional anesthesia - Anticipate vaginal delivery  5. Post Partum Planning: - Infant feeding: Breast - Contraception: OCPs  Wilma Wuthrich, CNM 10/12/2019 9:46 AM

## 2019-10-12 NOTE — Discharge Summary (Signed)
Obstetrical Discharge Summary  Patient Name: Brooke Logan DOB: 08/15/1995 MRN: [id number]  Date of Admission: 10/12/2019 Date of Delivery: 10/12/19 Delivered by: Haroldine Laws Date of Discharge: 10/14/2019  Primary OB: Phineas Real [id number] last menstrual period was 01/05/2019 (within days). EDC Estimated Date of Delivery: 10/12/19 Gestational Age at Delivery: [redacted]w[redacted]d   Antepartum complications:  1. BMI 30.7  Admitting Diagnosis: Labor and SROM Secondary Diagnosis: Patient Active Problem List   Diagnosis Date Noted   NSVD (normal spontaneous vaginal delivery) 10/12/2019   Bacterial vaginosis 10/09/2019   Asthma affecting pregnancy in second trimester 06/19/2019    Augmentation: Pitocin Complications: Maternal temp at delivery  Intrapartum complications/course:  She progressed to complete and had a spontaneous vaginal birth of a live female over an intact perineum. The fetal head was delivered in OA position with restitution to LOA. No nuchal cord. Anterior then posterior shoulders delivered spontaneously. Baby placed on mom's abdomen and attended to by transition RN. Baby required more attention so the cord clamped and cut quickly by FOB and baby was taken to the warmer. Cord blood obtained for newborn labs.  Delivery Type: spontaneous vaginal delivery Anesthesia: epidural Placenta: spontaneous Laceration: 1st degree and periurethral Episiotomy: none Newborn Data: Live born female  Birth Weight: 8 lb 8.5 oz (3870 g) APGAR: 5, 9  Newborn Delivery   Birth date/time: 10/12/2019 19:28:00 Delivery type: Vaginal, Spontaneous      Postpartum Procedures: none  Post partum course:  Patient had an uncomplicated postpartum course.  By time of discharge on PPD#2, her pain was controlled on oral pain medications; she had appropriate lochia and was ambulating, voiding without difficulty and tolerating regular diet.  She was deemed stable for discharge to home.     Discharge Physical Exam:  BP 112/72 (BP Location: Right Arm)    Pulse 76    Temp 98.4 F (36.9 C) (Oral)    Resp 20    Ht 5\' 4"  (1.626 m)    Wt 86.6 kg    LMP 01/05/2019 (Within Days) Comment: abnormal   SpO2 100%    Breastfeeding Unknown    BMI 32.79 kg/m   General: alert and no distress Pulm: normal respiratory effort Lochia: appropriate Abdomen: soft, NT Uterine Fundus: firm, below umbilicus Extremities: No evidence of DVT seen on physical exam. No lower extremity edema.  Edinburgh:  Edinburgh Postnatal Depression Scale Screening Tool 10/13/2019 10/13/2019 10/12/2019  I have been able to laugh and see the funny side of things. 0 (No Data) (No Data)  I have looked forward with enjoyment to things. 0 - -  I have blamed myself unnecessarily when things went wrong. 0 - -  I have been anxious or worried for no good reason. 0 - -  I have felt scared or panicky for no good reason. 0 - -  Things have been getting on top of me. 1 - -  I have been so unhappy that I have had difficulty sleeping. 0 - -  I have felt sad or miserable. 0 - -  I have been so unhappy that I have been crying. 0 - -  The thought of harming myself has occurred to me. 0 - -  Edinburgh Postnatal Depression Scale Total 1 - -     Labs: CBC Latest Ref Rng & Units 10/13/2019 10/12/2019 03/27/2019  WBC 4.0 - 10.5 K/uL 13.0(H) 6.8 6.7  Hemoglobin 12.0 - 15.0 g/dL 11.0(L) 13.8 13.2  Hematocrit 36 - 46 % 32.7(L) 40.4 40.2  Platelets 150 - 400 K/uL 248 264 304   O POS Performed at [facility], [address] [place], [place] [address]  Hemoglobin  Date Value Ref Range Status  10/13/2019 11.0 (L) 12.0 - 15.0 g/dL Final  48/18/5631 49.7 11.1 - 15.9 g/dL Final   HCT  Date Value Ref Range Status  10/13/2019 32.7 (L) 36 - 46 % Final   Hematocrit  Date Value Ref Range Status  03/27/2019 40.2 34.0 - 46.6 % Final    Disposition: stable, discharge to home Baby Feeding: breastmilk Baby Disposition: home with  mom  Contraception: OCPs  Prenatal Labs:  Rh O+  Antibody screen neg  Rubella Immune   Varicella Immune   RPR NR  HBsAg Neg  HIV NR  GC neg  Chlamydia neg  Genetic screening MaterniT21 negative, XY  1 hour GTT 78  3 hour GTT n/a  GBS negative    Rh Immune globulin given: n/a Rubella vaccine given: n/a Varicella vaccine given: n/a Tdap vaccine given in AP or PP setting: 08/02/2019  Plan: Fransico Meadow was discharged to home in good condition. Follow-up appointment with delivering provider in 6 weeks.  Discharge Instructions: Per After Visit Summary. Activity: Advance as tolerated. Pelvic rest for 6 weeks.   Diet: Regular Discharge Medications: Allergies as of 10/14/2019   No Known Allergies     Medication List    STOP taking these medications   metroNIDAZOLE 500 MG tablet Commonly known as: Flagyl     TAKE these medications   ibuprofen 600 MG tablet Commonly known as: ADVIL Take 1 tablet (600 mg total) by mouth every 6 (six) hours as needed for mild pain, moderate pain or cramping.   Prenatal Vitamin 27-0.8 MG Tabs Take 1 tablet by mouth daily.      Outpatient follow up:   Follow-up Information    Haroldine Laws, CNM. Schedule an appointment as soon as possible for a visit in 6 week(s).   Specialty: Certified Nurse Midwife Why: For routine postpartum visit Contact information: [address] [place] [address] [phone]               Signed: Genia Del, CNM 10/14/2019 9:09 AM

## 2019-10-12 NOTE — Progress Notes (Signed)
Labor Progress Note  Brooke Logan Brooke Logan is a 24 y.o. G1P0000 at [redacted]w[redacted]d by LMP admitted for active labor and SROM  Subjective: Getting more comfortable with epidural but reports lower back pain.  Objective: BP 131/84   Pulse 97   Temp 98.5 F (36.9 C) (Oral)   Resp 20   Ht 5\' 4"  (1.626 m)   Wt 86.6 kg   LMP 01/05/2019 (Within Days) Comment: abnormal  BMI 32.79 kg/m   Fetal Assessment: FHT:  FHR: 135 bpm, variability: moderate,  accelerations:  Present,  decelerations:  Absent Category/reactivity:  Category I UC:   regular, every 3 minutes SVE:    Dilation: 7cm  Effacement: 100%  Station:  0  Consistency: soft  Position: anterior  Membrane status: SROM at 0730 Amniotic color: Clear  Labs: Results for orders placed or performed during the hospital encounter of 10/12/19 (from the past 24 hour(s))  ROM Plus ([facility] only)     Status: None   Collection Time: 10/12/19  9:13 AM  Result Value Ref Range   Rom Plus POSITIVE   SARS Coronavirus 2 by RT PCR (hospital order, performed in [facility] hospital lab) Nasopharyngeal     Status: None   Collection Time: 10/12/19  9:13 AM   Specimen: Nasopharyngeal  Result Value Ref Range   SARS Coronavirus 2 NEGATIVE NEGATIVE  CBC     Status: None   Collection Time: 10/12/19 10:29 AM  Result Value Ref Range   WBC 6.8 4.0 - 10.5 K/uL   RBC 4.69 3.87 - 5.11 MIL/uL   Hemoglobin 13.8 12.0 - 15.0 g/dL   HCT 12/13/19 36 - 46 %   MCV 86.1 80.0 - 100.0 fL   MCH 29.4 26.0 - 34.0 pg   MCHC 34.2 30.0 - 36.0 g/dL   RDW 86.7 54.4 - 92.0 %   Platelets 264 150 - 400 K/uL   nRBC 0.0 0.0 - 0.2 %  Type and screen [facility]     Status: None   Collection Time: 10/12/19 10:29 AM  Result Value Ref Range   ABO/RH(D) O POS    Antibody Screen NEG    Sample Expiration      10/15/2019,2359 Performed at [facility], [address] [place], [place]   Comprehensive metabolic panel     Status: Abnormal    Collection Time: 10/12/19 10:29 AM  Result Value Ref Range   Sodium 134 (L) 135 - 145 mmol/L   Potassium 3.6 3.5 - 5.1 mmol/L   Chloride 103 98 - 111 mmol/L   CO2 20 (L) 22 - 32 mmol/L   Glucose, Bld 91 70 - 99 mg/dL   BUN 13 6 - 20 mg/dL   Creatinine, Ser 12/13/19 0.44 - 1.00 mg/dL   Calcium 9.5 8.9 - 7.58 mg/dL   Total Protein 7.1 6.5 - 8.1 g/dL   Albumin 3.2 (L) 3.5 - 5.0 g/dL   AST 23 15 - 41 U/L   ALT 16 0 - 44 U/L   Alkaline Phosphatase 115 38 - 126 U/L   Total Bilirubin 0.7 0.3 - 1.2 mg/dL   GFR calc non Af Amer >60 >60 mL/min   GFR calc Af Amer >60 >60 mL/min   Anion gap 11 5 - 15  Protein / creatinine ratio, urine     Status: Abnormal   Collection Time: 10/12/19 10:29 AM  Result Value Ref Range   Creatinine, Urine 94 mg/dL   Total Protein, Urine 20 mg/dL  Protein Creatinine Ratio 0.21 (H) 0.00 - 0.15 mg/mg[Cre]  ABO/Rh     Status: None   Collection Time: 10/12/19 11:01 AM  Result Value Ref Range   ABO/RH(D)      O POS Performed at [facility], [address] [place], [place] [address]     Assessment / Plan: Spontaneous labor, progressing normally  Labor: Progressing normally Preeclampsia:  131-140 / 84-98, AST&ALT WNL, PCR 0.21, Plt 264 Dr. Dalbert Garnet notified Fetal Wellbeing:  Category I Pain Control:  Epidural I/D:  Afebrile, GBS neg, SROM x 5 hours Anticipated MOD:  NSVD  Jenifer Orbie Pyo, CNM 10/12/2019, 12:40 PM

## 2019-10-12 NOTE — Anesthesia Preprocedure Evaluation (Signed)
Anesthesia Evaluation  Patient identified by MRN, date of birth, ID band Patient awake    Reviewed: Allergy & Precautions, H&P , NPO status , Patient's Chart, lab work & pertinent test results  History of Anesthesia Complications Negative for: history of anesthetic complications  Airway Mallampati: II  TM Distance: >3 FB Neck ROM: full    Dental no notable dental hx.    Pulmonary neg pulmonary ROS,    Pulmonary exam normal        Cardiovascular negative cardio ROS Normal cardiovascular exam     Neuro/Psych negative neurological ROS  negative psych ROS   GI/Hepatic negative GI ROS, Neg liver ROS,   Endo/Other  negative endocrine ROS  Renal/GU negative Renal ROS  negative genitourinary   Musculoskeletal   Abdominal   Peds  Hematology negative hematology ROS (+)   Anesthesia Other Findings   Reproductive/Obstetrics (+) Pregnancy                             Anesthesia Physical Anesthesia Plan  ASA: II  Anesthesia Plan:    Post-op Pain Management:    Induction:   PONV Risk Score and Plan:   Airway Management Planned:   Additional Equipment:   Intra-op Plan:   Post-operative Plan:   Informed Consent: I have reviewed the patients History and Physical, chart, labs and discussed the procedure including the risks, benefits and alternatives for the proposed anesthesia with the patient or authorized representative who has indicated his/her understanding and acceptance.       Plan Discussed with: CRNA and Anesthesiologist  Anesthesia Plan Comments:         Anesthesia Quick Evaluation

## 2019-10-13 LAB — RPR: RPR Ser Ql: NONREACTIVE

## 2019-10-13 LAB — CBC
HCT: 32.7 % — ABNORMAL LOW (ref 36.0–46.0)
Hemoglobin: 11 g/dL — ABNORMAL LOW (ref 12.0–15.0)
MCH: 29.2 pg (ref 26.0–34.0)
MCHC: 33.6 g/dL (ref 30.0–36.0)
MCV: 86.7 fL (ref 80.0–100.0)
Platelets: 248 10*3/uL (ref 150–400)
RBC: 3.77 MIL/uL — ABNORMAL LOW (ref 3.87–5.11)
RDW: 13.2 % (ref 11.5–15.5)
WBC: 13 10*3/uL — ABNORMAL HIGH (ref 4.0–10.5)
nRBC: 0 % (ref 0.0–0.2)

## 2019-10-13 MED ORDER — LACTATED RINGERS IV BOLUS
500.0000 mL | Freq: Once | INTRAVENOUS | Status: AC
  Administered 2019-10-13: 500 mL via INTRAVENOUS

## 2019-10-13 MED ORDER — IBUPROFEN 600 MG PO TABS
600.0000 mg | ORAL_TABLET | Freq: Four times a day (QID) | ORAL | Status: DC
  Administered 2019-10-13 – 2019-10-14 (×5): 600 mg via ORAL
  Filled 2019-10-13 (×5): qty 1

## 2019-10-13 MED ORDER — MISOPROSTOL 100 MCG PO TABS
25.0000 ug | ORAL_TABLET | ORAL | Status: DC | PRN
  Filled 2019-10-13: qty 1

## 2019-10-13 MED ORDER — LACTATED RINGERS IV SOLN: INTRAVENOUS | Status: DC

## 2019-10-13 NOTE — Lactation Note (Addendum)
This note was copied from a baby's chart. Lactation Consultation Note  Patient Name: Boy Marquise Lambson [id number] Date: 10/13/2019 Reason for consult: Initial assessment;Primapara;Term   Maternal Data Formula Feeding for Exclusion: No Has patient been taught Hand Expression?: Yes Does the patient have breastfeeding experience prior to this delivery?: No MOm was given a nipple shield during the night shift, I don't feel this is needed, also given a breast pump to pump before feeds?, I told mom that if baby was latching and nursing well, she wouldn't need to pump at present and then if needed to pump for poor feeding she could pump after a feeding if needed.  Encouraged practicing breastfeeding right now to learn how to breastfeed and make milk, she also wants to formula feed when going back to work and I encouraged her to focus on breastfeeding at present and introduce formula later.  Feeding Feeding Type: Breast Fed Baby and mom in side lying position, baby latches easily with sandwiching breast, mom has full, sl firming breasts, once latched well swallows noted and baby only needs occ. stimulation to continue nursing   LATCH Score Latch: Grasps breast easily, tongue down, lips flanged, rhythmical sucking.  Audible Swallowing: Spontaneous and intermittent  Type of Nipple: Everted at rest and after stimulation  Comfort (Breast/Nipple): Soft / non-tender  Hold (Positioning): Assistance needed to correctly position infant at breast and maintain latch.  LATCH Score: 9  Interventions Interventions: Breast feeding basics reviewed;Assisted with latch;Skin to skin;Breast massage;Hand express;Adjust position;Support pillows  Lactation Tools Discussed/Used WIC Program: Yes LC name and no. Written on white board  Consult Status Consult Status: PRN    Dyann Kief 10/13/2019, 1:53 PM

## 2019-10-13 NOTE — Progress Notes (Signed)
Post Partum Day 1  Subjective: Doing well, no concerns. Ambulating without difficulty, pain managed with PO meds, tolerating regular diet, and voiding without difficulty.   No fever/chills, chest pain, shortness of breath, nausea/vomiting, or leg pain. No nipple or breast pain.   Objective: BP 115/82 (BP Location: Right Arm)   Pulse 100   Temp 98.4 F (36.9 C) (Oral)   Resp 20   Ht 5\' 4"  (1.626 m)   Wt 86.6 kg   LMP 01/05/2019 (Within Days) Comment: abnormal  SpO2 100% Comment: Room Air  Breastfeeding Unknown   BMI 32.79 kg/m    Physical Exam:  General: alert, cooperative, appears stated age and no distress Breasts: soft/nontender CV: RRR Pulm: nl effort, CTABL Abdomen: soft, non-tender, active bowel sounds Lochia: appropriate DVT Evaluation: No evidence of DVT seen on physical exam. No cords or calf tenderness. No significant calf/ankle edema.  Recent Labs    10/12/19 1029 10/13/19 0554  HGB 13.8 11.0*  HCT 40.4 32.7*  WBC 6.8 13.0*  PLT 264 248    Assessment/Plan: 23 y.o. G1P1001 postpartum day # 1  -Continue routine postpartum care -Lactation consult PRN for breastfeeding  -Immunization status: varicella and rubella immunities pending  Disposition: Continue inpatient postpartum care    LOS: 1 day   12/14/19, CNM 10/13/2019, 12:57 PM   ----- 12/14/2019 Certified Nurse Midwife [facility]

## 2019-10-13 NOTE — Anesthesia Postprocedure Evaluation (Signed)
Anesthesia Post Note  Patient: Brooke Logan  Procedure(s) Performed: AN AD HOC LABOR EPIDURAL  Patient location during evaluation: Mother Baby Anesthesia Type: Epidural Level of consciousness: awake and alert Pain management: pain level controlled Vital Signs Assessment: post-procedure vital signs reviewed and stable Respiratory status: spontaneous breathing, nonlabored ventilation and respiratory function stable Cardiovascular status: stable Postop Assessment: no headache, no backache and epidural receding Anesthetic complications: no   No complications documented.   Last Vitals:  Vitals:   10/13/19 0018 10/13/19 0335  BP: 109/66 107/66  Pulse: (!) 112 92  Resp: 16 16  Temp: 37 C 36.7 C  SpO2: 99% 97%    Last Pain:  Vitals:   10/13/19 0335  TempSrc: Oral  PainSc:                  Rosanne Gutting

## 2019-10-14 LAB — MEASLES/MUMPS/RUBELLA IMMUNITY
Mumps IgG: 12.2 AU/mL (ref >10.9)
Rubella: 1.52 index (ref >0.99)
Rubeola IgG: <13.5 AU/mL — ABNORMAL LOW (ref >16.4)

## 2019-10-14 LAB — VARICELLA ZOSTER ANTIBODY, IGG: Varicella IgG: 515 index (ref >165)

## 2019-10-14 MED ORDER — IBUPROFEN 600 MG PO TABS: 600.0000 mg | ORAL_TABLET | Freq: Four times a day (QID) | ORAL | 0 refills | Status: AC | PRN

## 2019-10-14 NOTE — Discharge Instructions (Signed)
\ Consejos sobre cmo lograr un buen agarre para Museum/gallery exhibitions officer Breastfeeding Tips for a Good Latch El agarre, o que el beb "se prenda", se refiere a cmo la boca del beb se agarra al pezn al [employer]. Es una parte importante de la Market researcher. El beb puede tener problemas para prenderse a la mama por diversos motivos. Si el beb no se prende bien, a usted se Associate Professor o [employer], o puede tener otros [place]. Siga estas indicaciones en su casa: Cmo ubicar al beb  Busque un lugar cmodo para sentarse o acostarse. Es importante que tenga bien apoyados el cuello y la espalda.  Si est sentada, coloque una almohada o una manta enrollada debajo del beb. De este modo, lo acercar al nivel de su mama.  Asegrese de que el vientre (abdomen) del beb est contra el suyo.  Pruebe diferentes posiciones hasta encontrar una que funcione tanto para usted como para el beb. Cmo ayudar a que el beb tenga un buen agarre   Para comenzar, frtese la mama con suavidad. Con las yemas de los dedos, masajee la pared del pecho hacia el pezn en un movimiento circular. Esto estimula el flujo de la [place]. Si es necesario, contine Sports coach.  Posicione su mama. Sostenga la mama con el pulgar por arriba del pezn y los otros cuatro dedos por debajo de la mama. Mantenga los dedos lejos del pezn y de la boca del beb. Siga estos pasos para ayudar al beb a prenderse: 1. Frote suavemente los labios del beb con el pezn o con el dedo. 2. Cuando la boca del beb se abra lo suficiente, acrquelo rpidamente a la mama e introduzca todo el pezn dentro de la boca del beb. Introduzca en la boca del beb tanto de la zona de color que rodea el pezn (la arola)como sea posible. 3. La lengua del beb debe estar entre la enca inferior y la [place]. 4. Debe poder ver ms arola por arriba del labio superior del beb que por debajo del labio inferior. 5. Cuando el beb empiece a  succionar, sentir un jaln suave en el pezn. No debera sentir nada de dolor. Tenga paciencia. Es comn que un beb succione durante 2 o antes de que comience el flujo de [place]. 6. Asegrese de que la boca del beb est posicionada correctamente alrededor del pezn. Los labios del beb deben crear un sello sobre la mama y estar orientados hacia fuera.  Instrucciones generales  Busque estos signos de que el beb se ha prendido bien al pezn: ? El beb tironea o succiona con tranquilidad, sin causarle dolor. ? Oye que el beb traga despus de succionar 3 o 4 veces. ? Ve movimiento por arriba y delante de las orejas del beb mientras succiona.  Preste atencin a estos signos de que el beb no se ha prendido bien al pezn: ? El beb hace sonidos al succionar o chasquidos mientras amamanta. ? Tiene dolor en los pezones.  Si el beb no est bien prendido, introduzca el [employer] del beb y el pezn. De este modo romper el sello. Luego, intente ayudar al beb a prenderse otra vez.  Si contina teniendo problemas, solicite ayuda a un especialista en lactancia (asesor en Market researcher). Comunquese con un mdico si:  Presenta agrietamiento o irritacin en los pezones durante ms de 1semana.  Tiene dolor en los pezones.  Tiene demasiada [employer] mamas (congestin [place]) y no mejora despus de un  lapso de 48 a 72 horas.  Tiene una obstruccin en un conducto mamario y [place].  Sigue los consejos para que el beb se prenda bien, pero contina teniendo problemas o preocupaciones.  Observa que sale un lquido similar al pus por la mama.  Su beb no aumenta de peso.  Su beb pierde peso. Resumen  El agarre, o que el beb "se prenda", se refiere a cmo la boca del beb se agarra al pezn al [employer].  Pruebe posiciones diferentes para Copy una que funcione tanto para usted como para el beb.  Si el beb no se prende bien, a usted se  Associate Professor o [employer], o puede tener otros [place]. Esta informacin no tiene Theme park manager el consejo del mdico. Asegrese de hacerle al mdico cualquier pregunta que tenga. Document Revised: 12/24/2016 Document Reviewed: 12/24/2016 Elsevier Patient Education  2020 [employer].  Parto vaginal, cuidados de puerperio Postpartum Care After Vaginal Delivery Lea esta informacin sobre cmo cuidarse desde el momento en que nazca su beb y Brooke Logan 6 a 12 semanas despus del parto (perodo del posparto). El mdico tambin podr darle instrucciones ms especficas. Comunquese con su mdico si tiene problemas o preguntas. Siga estas indicaciones en su casa: Hemorragia vaginal  Es normal tener un poco de hemorragia vaginal (loquios) despus del parto. Use un apsito sanitario para el sangrado vaginal y secrecin. ? Durante la primera semana despus del parto, la cantidad y el aspecto de los loquios a menudo es similar a las del perodo menstrual. ? Durante las siguientes semanas disminuir gradualmente hasta convertirse en una secrecin seca amarronada o [place]. ? En la [employer], los loquios se detienen [employer] 4 a 6semanas despus del [place]. Los sangrados vaginales pueden variar de mujer a Nurse, learning disability.  Cambie los apsitos sanitarios con frecuencia. Observe si hay cambios en el flujo, como: ? Un aumento repentino en el volumen. ? Cambio en el color. ? Cogulos sanguneos grandes.  Si expulsa un cogulo de sangre por la vagina, gurdelo y llame al mdico para informrselo. No deseche los cogulos de sangre por el inodoro antes de hablar con su mdico.  No use tampones ni se haga duchas vaginales hasta que el mdico la autorice.  Si no est amamantando, volver a tener su perodo entre 6 y 8 semanas despus del parto. Si solamente alimenta al beb con Alexis Goodell materna (lactancia materna exclusiva), podra no volver a tener su perodo hasta que deje de  [place]. Cuidados perineales  Mantenga la zona entre la vagina y el ano (perineo) limpia y [place], [place] se lo haya indicado el mdico. Utilice apsitos o aerosoles analgsicos y Control and instrumentation engineer, como se lo hayan indicado.  Si le hicieron un corte en el perineo (episiotoma) o tuvo un desgarro en la vagina, controle la zona para detectar signos de infeccin hasta que sane. Est atenta a los siguientes signos: ? Aumento del enrojecimiento, la hinchazn o Chief Technology Officer. ? Presenta lquido o sangre que supura del corte o Insurance account manager. ? Calor. ? Pus o mal olor.  Es posible que le den una botella rociadora para que use en lugar de limpiarse el rea con papel higinico despus de usar el bao. Cuando comience a Barrister's clerk, podr usar la botella rociadora antes de secarse. Asegrese de secarse suavemente.  Para aliviar el dolor causado por una episiotoma, un desgarro en la vagina o venas hinchadas en el ano (hemorroides), trate de tomar un bao de asiento tibio 2 o 3  veces por da. Un bao de asiento es un bao de agua tibia que se toma mientras se est sentado. El agua solo debe Adult nursellegar hasta las caderas y cubrir las nalgas. Cuidado de las [address]  En los [address] das despus del parto, las mamas pueden sentirse pesadas, llenas e incmodas (congestin [place]amaria). Tambin puede escaparse leche de sus senos. El mdico puede sugerirle mtodos para Emergency planning/management officeraliviar este malestar. La congestin mamaria debera desaparecer al cabo de The Mutual of Omahaunos das.  Si est amamantando: ? Use un sostn que sujete y ajuste bien sus pechos. ? Mantenga los pezones secos y limpios. Aplquese cremas y ungentos, como se lo haya indicado el mdico. ? Es posible que deba usar discos de algodn en el sostn para Environmental health practitionerabsorber la Casnovialeche que se filtre de sus senos. ? Puede tener contracciones uterinas cada vez que amamante durante varias semanas despus del [place]. Las contracciones uterinas ayudan al tero a Hotel managerregresar a su tamao habitual. ? Si tiene algn problema con la  lactancia materna, colabore con el mdico o un Holiday representativeasesor en lactancia.  Si no est amamantando: ? Evite tocarse Xcel Energymucho las mamas. Al hacerlo, podran producir ms leche. ? Use un sostn que le proporcione el ajuste correcto y compresas fras para reducir la hinchazn. ? No extraiga (saque) Colgate Palmoliveleche materna. Esto har que produzca ms WPS Resourcesleche. Intimidad y sexualidad  Pregntele al mdico cundo puede retomar la actividad sexual. Esto puede depender de lo siguiente: ? Su riesgo de sufrir infecciones. ? La rapidez con la que est sanando. ? Su comodidad y deseo de retomar la actividad sexual.  Despus del parto, puede quedar embarazada incluso si no ha tenido todava su perodo. Si lo desea, hable con el mdico acerca de los mtodos de control de la natalidad (mtodos anticonceptivos). Medicamentos  Baxter Internationalome los medicamentos de venta libre y los recetados solamente como se lo haya indicado el mdico.  Si le recetaron un antibitico, tmelo como se lo haya indicado el mdico. No deje de tomar el antibitico aunque comience a sentirse mejor. Actividad  Retome sus actividades normales de a poco como se lo haya indicado el mdico. Pregntele al mdico qu actividades son seguras para usted.  Descanse todo lo que pueda. Trate de descansar o tomar una siesta mientras el beb duerme. Comida y bebida   Beba suficiente lquido como para Pharmacologistmantener la orina de color amarillo plido.  Coma alimentos ricos en Enbridge Energyfibras todos los das. Estos pueden ayudarla a prevenir o Educational psychologistaliviar el estreimiento. Los alimentos ricos en fibras incluyen, entre otros: ? Panes y cereales integrales. ? Arroz integral. ? Armed forces operational officerrijoles. ? Nils PyleFrutas y verduras frescas.  No intente perder de peso rpidamente reduciendo el consumo de caloras.  Tome sus vitaminas prenatales hasta la visita de seguimiento de posparto o hasta que su mdico le indique que puede dejar de tomarlas. Estilo de vida  No consuma ningn producto que contenga nicotina o  tabaco, como cigarrillos y Administrator, Civil Servicecigarrillos electrnicos. Si necesita ayuda para dejar de fumar, consulte al mdico.  No beba alcohol, especialmente si est amamantando. Instrucciones generales  Concurra a todas las visitas de seguimiento para usted y el beb, como se lo haya indicado el mdico. La mayora de las mujeres visita al mdico para un seguimiento de posparto dentro de las primeras 3 a 6 semanas despus del parto. Comunquese con un mdico si:  Se siente incapaz de controlar los cambios que implica tener un hijo y esos sentimientos no desaparecen.  Siente tristeza o preocupacin de forma inusual.  Las mamas  se ponen rojas, le duelen o se endurecen.  Tiene fiebre.  Tiene dificultad para retener la [place] o para impedir que la orina se escape.  Tiene poco inters o falta de inters en actividades que solan gustarle.  No ha amamantado nada y no ha tenido un perodo menstrual durante 12 semanas despus del [place].  Dej de amamantar al beb y no ha tenido su perodo menstrual durante 12 semanas despus de dejar de Museum/gallery exhibitions officer.  Tiene preguntas sobre su cuidado y el del beb.  Elimina un cogulo de sangre grande por la vagina. Solicite ayuda de inmediato si:  Midwife.  Tiene dificultad para respirar.  Tiene un dolor repentino e intenso en la pierna.  Tiene dolor intenso o clicos en el la parte inferior del abdomen.  Tiene una hemorragia tan intensa de la vagina que empapa ms de un apsito en [employer]. El sangrado no debe ser ms abundante que el perodo ms intenso que haya tenido.  Dolor de cabeza intenso.  Se desmaya.  Tiene visin borrosa o Nurse, adult.  Tiene secrecin vaginal con mal olor.  Tiene pensamientos acerca de lastimarse a usted misma o a su beb. Si alguna vez siente que puede lastimarse a usted misma o a Economist, o tiene pensamientos de poner fin a su vida, busque ayuda de inmediato. Puede dirigirse al departamento de  emergencias ms cercano o llamar a:  El servicio de emergencias de su localidad (911 en [place]  Una lnea de asistencia al suicida y Visual merchandiser en crisis, como la [employer] de Prevencin del Suicidio (National Suicide Prevention Lifeline), al [phone]. Est disponible las 24 horas del da. Resumen  El perodo de tiempo justo despus el parto y Brooke Logan 6 a 12 semanas despus del parto se denomina perodo posparto.  Retome sus actividades normales de a Naval architect se lo haya indicado el mdico.  Concurra a todas las visitas de seguimiento para usted y Dance movement psychotherapist beb, como se lo haya indicado el mdico. Esta informacin no tiene Theme park manager el consejo del mdico. Asegrese de hacerle al mdico cualquier pregunta que tenga. Document Revised: 07/10/2017 Document Reviewed: 03/21/2017 Elsevier Patient Education  2020 [employer].

## 2019-10-14 NOTE — Progress Notes (Signed)
Pt discharged with infant.  Discharge instructions, prescriptions and follow up appointment given to and reviewed with pt. Pt verbalized understanding. Escorted out by staff. 

## 2019-10-14 NOTE — Lactation Note (Signed)
This note was copied from a baby's chart. Lactation Consultation Note  Patient Name: Brooke Logan Date: 10/14/2019 Reason for consult: Follow-up assessment;Mother's request;Difficult latch;Primapara;Term;Other (Comment) (Parents have given bottles of formula)  Mom asked for assistance to latch [name] to the breast.  He was rooting and fussing all around the breast.  Assisted mom in comfortable position using pillow support lying on her right side.  Demonstrated how to sandwich breast and then wait until she felt him pull the breast in deeply before letting go.  Mom was having difficulty seeing in this position, but was more comfortable to mom.  After a couple of attempts, he latched well and began strong, rhythmic sucking with audible swallows which were pointed out to mom.  Parents were concerned that he was not getting anything, because he was still showing hunger cues after breast feeding for 30 minutes. Could easily hand express good amount of colostrum which relieved some of mom's concerns. Demonstrated how to burp him.  After he burped and passed gas, we swaddled him and he appeared satiated.  Mom wanted to pump.  Set up Symphony in room with instructions in breast massage, hand expression, pumping, collection, storage, cleaning, labeling and handling her expressed milk.  She expressed 8 ml.  Demonstrated how to finger feed using curved tip syringe instead of continuing to bottle feed.  Since he was contented, we did not supplement after this breast feed.  Encouraged mom to put [name] to the breast whenever he demonstrated feeding cues to bring in mature milk, ensure a plentiful milk supply and prevent engorgement.  Discussed normal newborn stomach size, adequate intake and out put, supply and demand, normal course of lactation and routine newborn feeding patterns.  Hand out given on lactation community resources and reviewed and lactation name and number written on white  board encouraging mom to call with any questions, concerns or assistancae.   Maternal Data Formula Feeding for Exclusion: No Has patient been taught Hand Expression?: Yes Does the patient have breastfeeding experience prior to this delivery?: No (P1)  Feeding Feeding Type: Breast Fed  LATCH Score Latch: Repeated attempts needed to sustain latch, nipple held in mouth throughout feeding, stimulation needed to elicit sucking reflex.  Audible Swallowing: A few with stimulation  Type of Nipple: Everted at rest and after stimulation  Comfort (Breast/Nipple): Soft / non-tender  Hold (Positioning): Assistance needed to correctly position infant at breast and maintain latch.  LATCH Score: 7  Interventions Interventions: Breast feeding basics reviewed;Assisted with latch;Breast massage;Hand express;Pre-pump if needed;Reverse pressure;Breast compression;Adjust position;Support pillows;Position options;Expressed milk;Coconut oil;Comfort gels;Hand pump;DEBP  Lactation Tools Discussed/Used WIC Program: Yes Pump Review: Setup, frequency, and cleaning;Milk Storage;Other (comment) Initiated by:: S.Hans Rusher,RN,BSN,IBCLC Date initiated:: 10/14/19   Consult Status Consult Status: PRN Follow-up type: Call as needed    Brooke Logan 10/14/2019, 11:05 AM

## 2019-10-17 LAB — SURGICAL PATHOLOGY

## 2020-10-28 IMAGING — US US MFM OB DETAIL+14 WK
1 series · 12 of 28 positions shown · non-contrast
Comparison: none

PATIENT INFO:

             NIVIRUS
PERFORMED BY:
                   Sonographer
                   RAINER CNM
SERVICE(S) PROVIDED:
 ----------------------------------------------------------------------
INDICATIONS:
  18 weeks gestation of pregnancy
FETAL EVALUATION:
 Num Of Fetuses:         1
 Fetal Heart Rate(bpm):  144
 Cardiac Activity:       Present
 Presentation:           Cephalic
 Placenta:               Anterior
 P. Cord Insertion:      Normal
BIOMETRY:
 BPD:      38.1  mm     G. Age:  17w 4d         33  %    CI:         74.6   %    70 - 86
                                                         FL/HC:      17.4   %    15.8 - 18
 HC:       140   mm     G. Age:  17w 2d         14  %    HC/AC:      1.14        1.07 -
 AC:      123.2  mm     G. Age:  17w 6d         46  %    FL/BPD:     63.8   %
 FL:       24.3  mm     G. Age:  17w 2d         22  %    FL/AC:      19.7   %    20 - 24
 HUM:      24.2  mm     G. Age:  17w 4d         41  %
 CER:      16.7  mm     G. Age:  16w 5d         21  %
 NFT:       3.1  mm
 CM:        6.2  mm
 Est. FW:     203  gm      0 lb 7 oz     29  %
GESTATIONAL AGE:
 LMP:           18w 0d        Date:  01/05/19                 EDD:   10/12/19
 U/S Today:     17w 4d                                        EDD:   10/15/19
 Best:          18w 0d     Det. By:  LMP  (01/05/19)          EDD:   10/12/19
ANATOMY:
 Cranium:               Within Normal Limits   Aortic Arch:            Normal appearance
 Cavum:                 CSP visualized         Ductal Arch:            Normal appearance
 Ventricles:            Normal appearance      Diaphragm:              Within Normal Limits
 Choroid Plexus:        Within Normal Limits   Stomach:                Seen
 Cerebellum:            Within Normal Limits   Abdomen:                Within Normal
                                                                       Limits
 Posterior Fossa:       Within Normal Limits   Abdominal Wall:         Normal appearance
 Nuchal Fold:           Within Normal Limits   Cord Vessels:           3 vessels
 Face:                  Orbits visualized      Kidneys:                Normal appearance
 Lips:                  Normal appearance      Bladder:                Seen
 Thoracic:              Within Normal Limits   Spine:                  Normal appearance
 Heart:                 4-Chamber view         Upper Extremities:      Visualized
                        appears normal
 RVOT:                  Normal appearance      Lower Extremities:      Visualized
 LVOT:                  Normal appearance
CERVIX UTERUS ADNEXA:
 Cervix
 Length:            3.5  cm.
 Uterus
 WNL
 Left Ovary
 Right Ovary

[Series 1: us mfm ob detail+14 wk · 0.20mm/px · 12 of 104 slices shown]
[im 4/104]
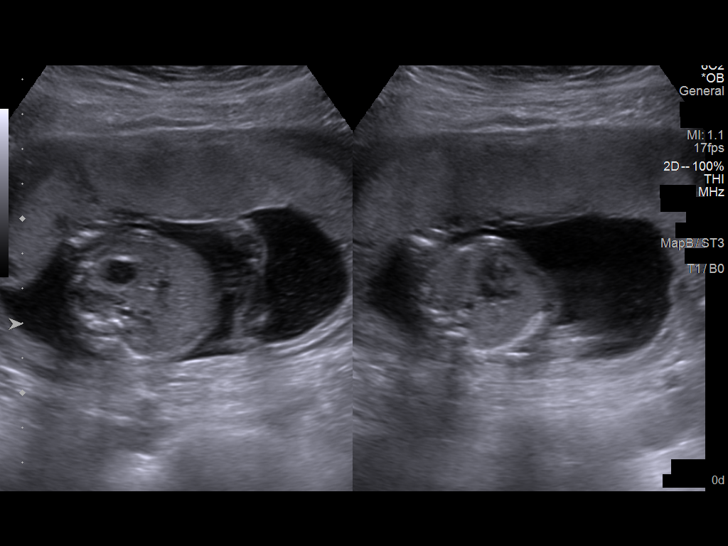
[im 12/104]
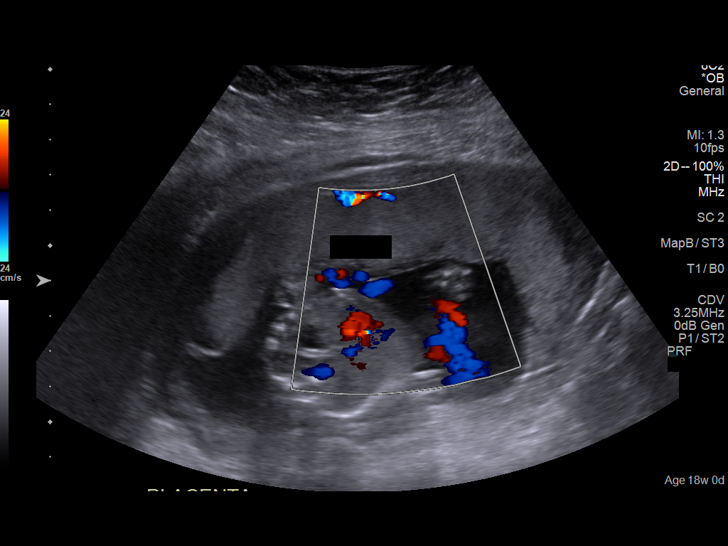
[im 20/104]
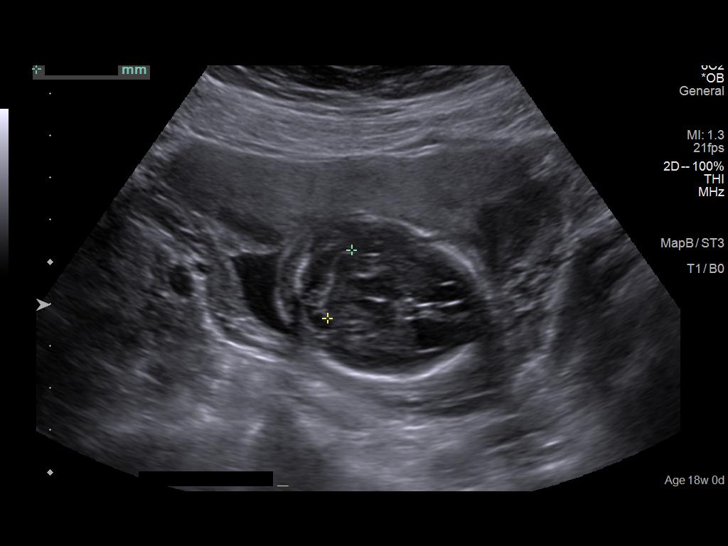
[im 31/104]
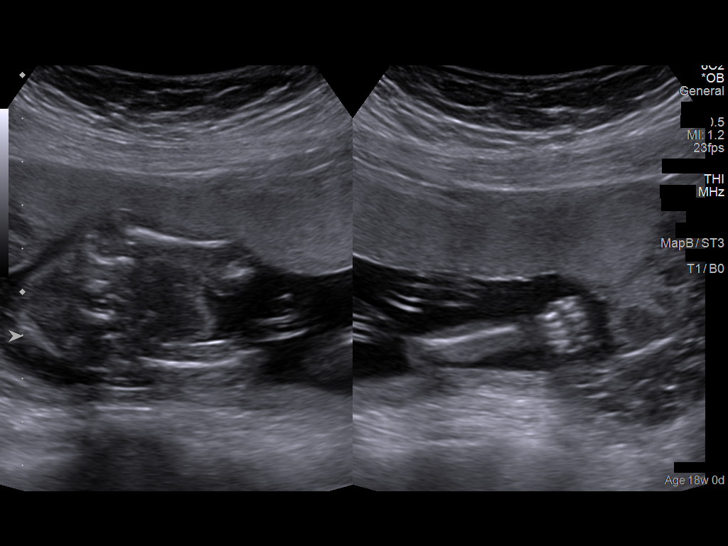
[im 39/104]
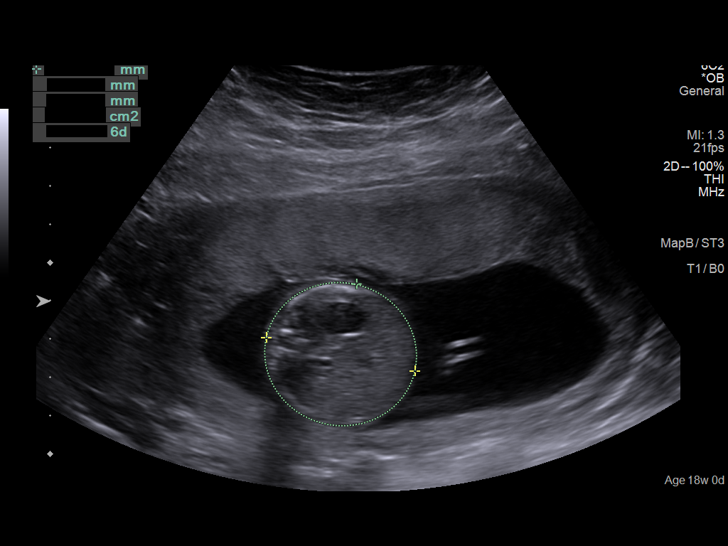
[im 46/104]
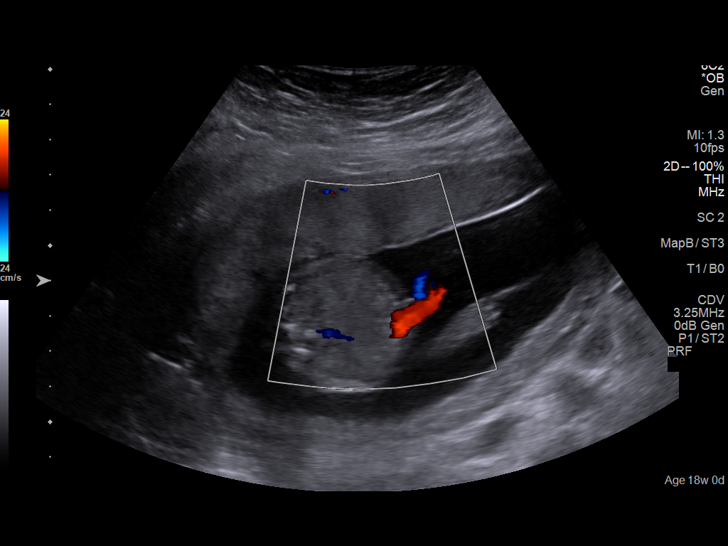
[im 58/104]
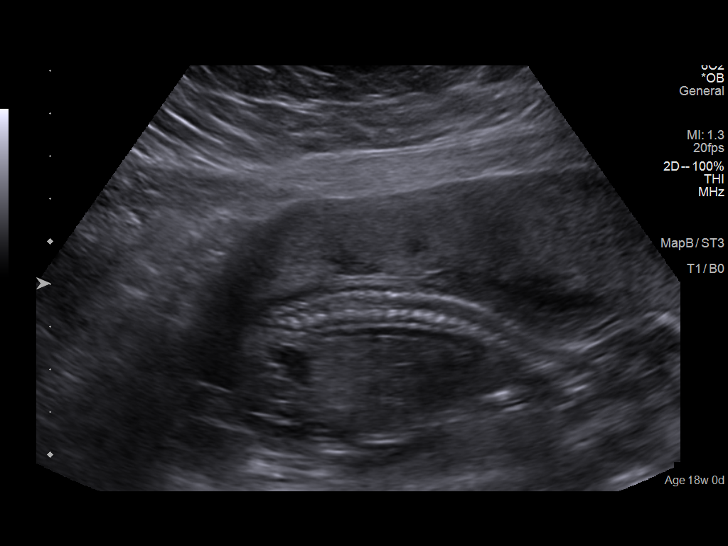
[im 65/104]
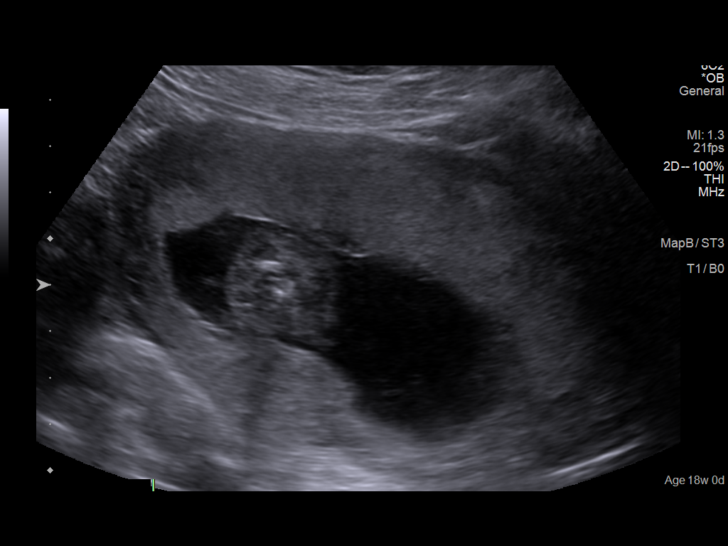
[im 73/104]
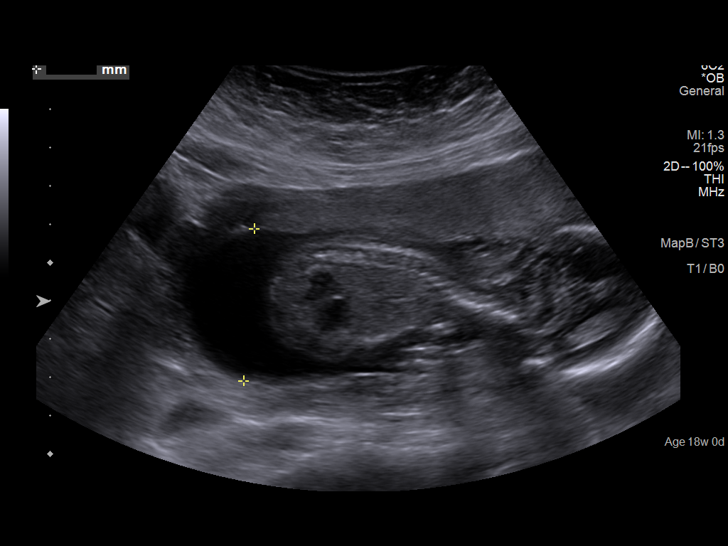
[im 84/104]
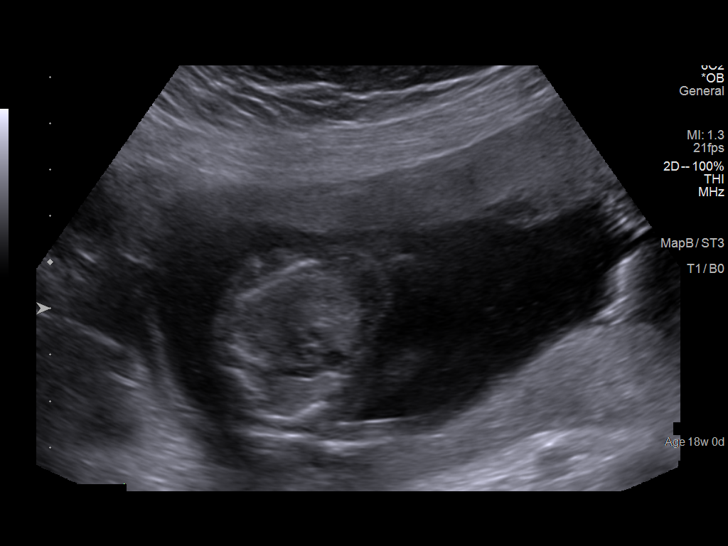
[im 92/104]
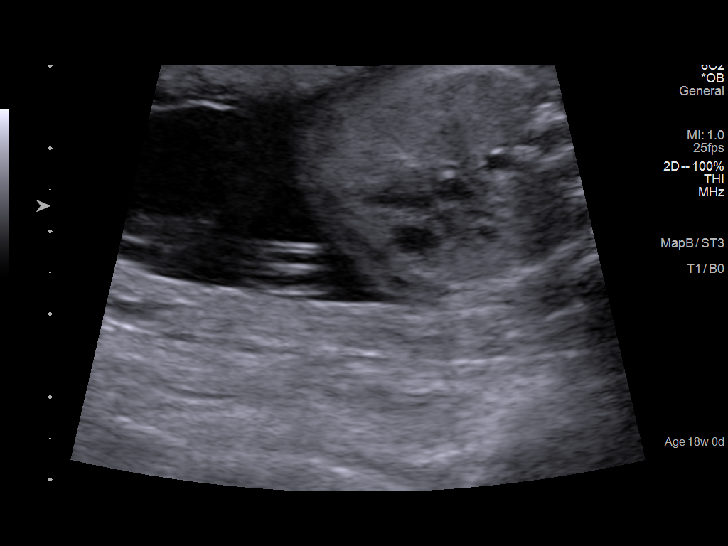
[im 100/104]
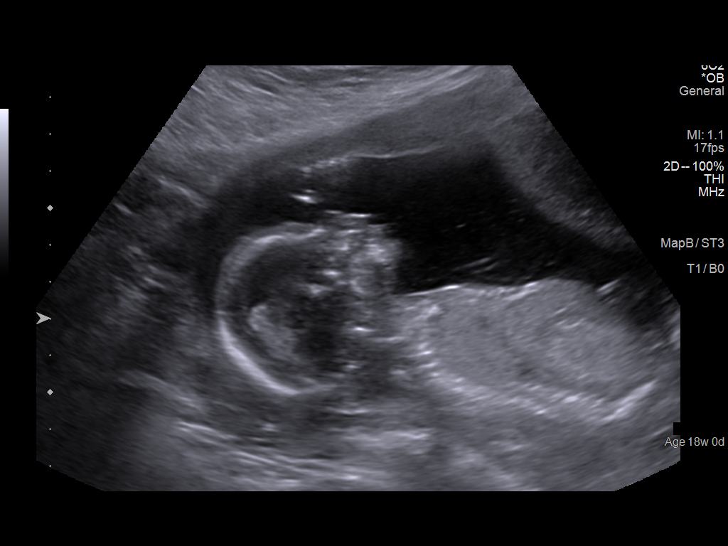

[12 of 28 positions shown; findings below may reference images not displayed]

IMPRESSION: Dear Dr.   RAINER,

 Thank you for referring your patient  for a fetal anatomical
 survey.  She previously had genetic counseling and elected
 to have cffDNA screening; results were negaitve/low risk for
 Trisomies [DATE].

 There is a singleton gestation at 18 weeks with subjectively
 normal amniotic fluid volume.  Dating is by LMP consistent
 with earliest available ultrasound performed at [facility]
 [HOSPITAL] on 04/03/19; measurements were consistent with
 12 weeks 4 days.

 The fetal biometry correlates with established dating.   The
 fetal anatomical survey appears within normal limits within
 the resolution of ultrasound as described above.

 It must be noted that a normal ultrasound cannot rule out
 aneuploidy.

 Thank you for allowing us to participate in your patient's care.

 assistance.

                  Fifaishvili, Indrive

## 2021-01-01 ENCOUNTER — Other Ambulatory Visit: Payer: Self-pay

## 2021-01-01 ENCOUNTER — Emergency Department: Payer: Self-pay

## 2021-01-01 ENCOUNTER — Emergency Department
Admission: EM | Admit: 2021-01-01 | Discharge: 2021-01-01 | Disposition: A | Discharge status: Home / Self Care | Payer: Self-pay | Attending: Emergency Medicine | Admitting: Emergency Medicine

## 2021-01-01 DIAGNOSIS — B349 Viral infection, unspecified: Secondary | ICD-10-CM | POA: Insufficient documentation

## 2021-01-01 DIAGNOSIS — J45909 Unspecified asthma, uncomplicated: Secondary | ICD-10-CM | POA: Insufficient documentation

## 2021-01-01 DIAGNOSIS — R112 Nausea with vomiting, unspecified: Secondary | ICD-10-CM

## 2021-01-01 DIAGNOSIS — Z20822 Contact with and (suspected) exposure to covid-19: Secondary | ICD-10-CM | POA: Insufficient documentation

## 2021-01-01 LAB — CBC WITH DIFFERENTIAL/PLATELET
Abs Immature Granulocytes: 0.05 10*3/uL (ref 0.00–0.07)
Basophils Absolute: 0 10*3/uL (ref 0.0–0.1)
Basophils Relative: 0 %
Eosinophils Absolute: 0 10*3/uL (ref 0.0–0.5)
Eosinophils Relative: 0 %
HCT: 42.5 % (ref 36.0–46.0)
Hemoglobin: 14.3 g/dL (ref 12.0–15.0)
Immature Granulocytes: 0 %
Lymphocytes Relative: 8 %
Lymphs Abs: 1 10*3/uL (ref 0.7–4.0)
MCH: 28.7 pg (ref 26.0–34.0)
MCHC: 33.6 g/dL (ref 30.0–36.0)
MCV: 85.3 fL (ref 80.0–100.0)
Monocytes Absolute: 1.1 10*3/uL — ABNORMAL HIGH (ref 0.1–1.0)
Monocytes Relative: 9 %
Neutro Abs: 10.3 10*3/uL — ABNORMAL HIGH (ref 1.7–7.7)
Neutrophils Relative %: 83 %
Platelets: 269 10*3/uL (ref 150–400)
RBC: 4.98 MIL/uL (ref 3.87–5.11)
RDW: 12.1 % (ref 11.5–15.5)
WBC: 12.5 10*3/uL — ABNORMAL HIGH (ref 4.0–10.5)
nRBC: 0 % (ref 0.0–0.2)

## 2021-01-01 LAB — URINALYSIS, COMPLETE (UACMP) WITH MICROSCOPIC
Bilirubin Urine: NEGATIVE
Glucose, UA: NEGATIVE mg/dL
Ketones, ur: NEGATIVE mg/dL
Leukocytes,Ua: NEGATIVE
Nitrite: NEGATIVE
Protein, ur: NEGATIVE mg/dL
Specific Gravity, Urine: 1.031 — ABNORMAL HIGH (ref 1.005–1.030)
pH: 5 (ref 5.0–8.0)

## 2021-01-01 LAB — COMPREHENSIVE METABOLIC PANEL
ALT: 13 U/L (ref 0–44)
AST: 18 U/L (ref 15–41)
Albumin: 4.6 g/dL (ref 3.5–5.0)
Alkaline Phosphatase: 78 U/L (ref 38–126)
Anion gap: 10 (ref 5–15)
BUN: 10 mg/dL (ref 6–20)
CO2: 23 mmol/L (ref 22–32)
Calcium: 9.2 mg/dL (ref 8.9–10.3)
Chloride: 101 mmol/L (ref 98–111)
Creatinine, Ser: 0.66 mg/dL (ref 0.44–1.00)
GFR, Estimated: >60 mL/min (ref >60)
Glucose, Bld: 122 mg/dL — ABNORMAL HIGH (ref 70–99)
Potassium: 4 mmol/L (ref 3.5–5.1)
Sodium: 134 mmol/L — ABNORMAL LOW (ref 135–145)
Total Bilirubin: 0.9 mg/dL (ref 0.3–1.2)
Total Protein: 8 g/dL (ref 6.5–8.1)

## 2021-01-01 LAB — POC URINE PREG, ED: Preg Test, Ur: NEGATIVE

## 2021-01-01 LAB — RESP PANEL BY RT-PCR (FLU A&B, COVID) ARPGX2
Influenza A by PCR: NEGATIVE
Influenza B by PCR: NEGATIVE
SARS Coronavirus 2 by RT PCR: NEGATIVE

## 2021-01-01 LAB — TROPONIN I (HIGH SENSITIVITY): Troponin I (High Sensitivity): 3 ng/L (ref <18)

## 2021-01-01 MED ORDER — ONDANSETRON HCL 4 MG/2ML IJ SOLN
4.0000 mg | Freq: Once | INTRAMUSCULAR | Status: AC
  Administered 2021-01-01: 4 mg via INTRAVENOUS
  Filled 2021-01-01: qty 2

## 2021-01-01 MED ORDER — ACETAMINOPHEN 500 MG PO TABS
1000.0000 mg | ORAL_TABLET | Freq: Once | ORAL | Status: AC
  Administered 2021-01-01: 1000 mg via ORAL
  Filled 2021-01-01: qty 2

## 2021-01-01 MED ORDER — LACTATED RINGERS IV BOLUS
1000.0000 mL | Freq: Once | INTRAVENOUS | Status: AC
  Administered 2021-01-01: 1000 mL via INTRAVENOUS

## 2021-01-01 MED ORDER — ONDANSETRON 4 MG PO TBDP: 4.0000 mg | ORAL_TABLET | Freq: Three times a day (TID) | ORAL | 0 refills | Status: AC | PRN

## 2021-01-01 NOTE — ED Notes (Signed)
See triage note  presents with low grade temp ,body aches with some vomiting  states sxs' started on Saturday  pt is also still breast feeding and having pain to left breast

## 2021-01-01 NOTE — ED Triage Notes (Signed)
Pt c/o fever with cough and sinus congestion for the past week. Also today having tenderness and "knot on the left breast". Pt states she is breast feeding.

## 2021-01-01 NOTE — ED Provider Notes (Signed)
[facility] Emergency Department Provider Note   ____________________________________________   Event Date/Time   First MD Initiated Contact with Patient 01/01/21 1437     (approximate)  I have reviewed the triage vital signs and the nursing notes.   HISTORY  Chief Complaint Cough, Breast Pain, and Fever    HPI Brooke Logan is a 25 y.o. female with possible history of asthma who presents to the ED complaining of fever and cough.  Patient reports that she has had a couple of days of subjective fevers, cough, body aches, vomiting, and diarrhea.  Symptoms got more severe today and she states she was unable to keep down either liquids or solids.  She denies any associated abdominal pain and has not had any dysuria or flank pain.  She denies any pain in her chest or difficulty breathing.  She does endorse soreness around her left breast, but she has not noticed any redness or swelling to the breast, denies nipple drainage.  She states that family members have been sick at home with a cough.        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 ago   Dyspnea    Reports feeling SOB @ night & wakes her up   Headache    Reports Migraine HA's 2x/wk-not had x 2 mths.   Infection    As a child-developed wound infection, hospitalized 3 mths.-[place]    Patient Active Problem List   Diagnosis Date Noted   NSVD (normal spontaneous vaginal delivery) 10/12/2019   Bacterial vaginosis 10/09/2019   Asthma affecting pregnancy in second trimester 06/19/2019    Past Surgical History:  Procedure Laterality Date   APPENDECTOMY  2013    Prior to Admission medications   Medication Sig Start Date End Date Taking? Authorizing Provider  ondansetron (ZOFRAN ODT) 4 MG disintegrating tablet Take 1 tablet (4 mg total) by mouth every 8 (eight) hours as needed for nausea  or vomiting. 01/01/21  Yes Chesley Noon, MD  ibuprofen (ADVIL) 600 MG tablet Take 1 tablet (600 mg total) by mouth every 6 (six) hours as needed for mild pain, moderate pain or cramping. 10/14/19   Genia Del, CNM  Prenatal Vit-Fe Fumarate-FA (PRENATAL VITAMIN) 27-0.8 MG TABS Take 1 tablet by mouth daily. 06/19/19   Alberteen Spindle, CNM    Allergies Patient has no known allergies.  Family History  Problem Relation Age of Onset   Heart disease Paternal Grandmother     Social History Social History   Tobacco Use   Smoking status: Never   Smokeless tobacco: Never  Vaping Use   Vaping Use: Never used  Substance Use Topics   Alcohol use: Not Currently    Comment: Last 02/10/19   Drug use: Never    Review of Systems  Constitutional: Positive for subjective fever/chills Eyes: No visual changes. ENT: No sore throat. Cardiovascular: Denies chest pain. Respiratory: Denies shortness of breath.  Positive for cough. Gastrointestinal: No abdominal pain.  Positive for nausea, vomiting, and diarrhea.  No constipation. Genitourinary: Negative for dysuria. Musculoskeletal: Negative for back pain.  Positive for body aches. Skin: Negative for rash.  Positive for left breast pain. Neurological: Negative for headaches, focal weakness or numbness.  ____________________________________________   PHYSICAL EXAM:  VITAL SIGNS: ED Triage Vitals [01/01/21 1219]  Enc Vitals Group     BP 131/83  Pulse Rate (!) 140     Resp 18     Temp 100.2 F (37.9 C)     Temp Source Oral     SpO2 96 %     Weight 165 lb (74.8 kg)     Height 5\' 7"  (1.702 m)     Head Circumference      Peak Flow      Pain Score      Pain Loc      Pain Edu?      Excl. in GC?     Constitutional: Alert and oriented. Eyes: Conjunctivae are normal. Head: Atraumatic. Nose: No congestion/rhinnorhea. Mouth/Throat: Mucous membranes are moist. Neck: Normal ROM Cardiovascular: Tachycardic, regular rhythm.  Grossly normal heart sounds.  2+ radial pulses bilaterally. Respiratory: Normal respiratory effort.  No retractions. Lungs CTAB.  Gastrointestinal: Soft and nontender. No distention. Genitourinary: deferred Musculoskeletal: No lower extremity tenderness nor edema. Neurologic:  Normal speech and language. No gross focal neurologic deficits are appreciated. Skin:  Skin is warm, dry and intact. No rash noted. Left upper quadrant breast tenderness with no erythema, edema, or fluctuance.  No nipple drainage noted. Psychiatric: Mood and affect are normal. Speech and behavior are normal.  ____________________________________________   LABS (all labs ordered are listed, but only abnormal results are displayed)  Labs Reviewed  COMPREHENSIVE METABOLIC PANEL - Abnormal; Notable for the following components:      Result Value   Sodium 134 (*)    Glucose, Bld 122 (*)    All other components within normal limits  CBC WITH DIFFERENTIAL/PLATELET - Abnormal; Notable for the following components:   WBC 12.5 (*)    Neutro Abs 10.3 (*)    Monocytes Absolute 1.1 (*)    All other components within normal limits  URINALYSIS, COMPLETE (UACMP) WITH MICROSCOPIC - Abnormal; Notable for the following components:   Color, Urine YELLOW (*)    APPearance CLEAR (*)    Specific Gravity, Urine 1.031 (*)    Hgb urine dipstick MODERATE (*)    Bacteria, UA RARE (*)    All other components within normal limits  RESP PANEL BY RT-PCR (FLU A&B, COVID) [id number]  POC URINE PREG, ED  TROPONIN I (HIGH SENSITIVITY)   ____________________________________________  EKG  ED ECG REPORT I, , the attending physician, personally viewed and interpreted this ECG.   Date: 01/01/2021  EKG Time: 12:23  Rate: 138  Rhythm: sinus tachycardia  Axis: RAD  Intervals:none  ST&T Change: Nonspecific ST/T changes   PROCEDURES  Procedure(s) performed (including Critical  Care):  Procedures   ____________________________________________   INITIAL IMPRESSION / ASSESSMENT AND PLAN / ED COURSE      25 year old female with possible history of asthma presents to the ED complaining of 2 days of cough, congestion, body aches, nausea, vomiting, and diarrhea.  Patient is tachycardic but overall well-appearing, not in any respiratory distress and has no focal abdominal tenderness.  Symptoms sound consistent with viral illness and we will perform testing for influenza and COVID-19.  Chest x-ray reviewed by me and shows no infiltrate, edema, or effusion.  No signs of infection associated with her breast soreness, no evidence of mastitis or breast abscess.  Labs are reassuring and UA shows no signs of infection, pregnancy testing is negative.  Tachycardia likely related to dehydration and viral illness, we will hydrate with IV fluids and treat symptomatically with Zofran.  Patient feeling much better following IV fluids and Zofran, troponin is within normal limits and tachycardia improving.  Given unremarkable work-up, suspect viral illness and patient is appropriate for discharge home.  She will prescribed Zofran and was counseled to drink plenty of fluids, follow-up with her PCP.  She was counseled to return to the ED for new worsening symptoms, patient agrees with plan.      ____________________________________________   FINAL CLINICAL IMPRESSION(S) / ED DIAGNOSES  Final diagnoses:  Viral syndrome  Suspected COVID-19 virus infection  Non-intractable vomiting with nausea, unspecified vomiting type     ED Discharge Orders          Ordered    ondansetron (ZOFRAN ODT) 4 MG disintegrating tablet  Every 8 hours PRN        01/01/21 1708             Note:  This document was prepared using Dragon voice recognition software and may include unintentional dictation errors.    Chesley Noon, MD 01/01/21 1710

## 2022-06-21 IMAGING — CR DG CHEST 2V
1 series · 2 of 2 positions shown · non-contrast
Comparison: None.

CLINICAL DATA: Cough and fever with sinus congestion

EXAM:
CHEST - 2 VIEW

[Series 1: dg chest 2 view · 0.14mm/px · 2 of 2 slices shown]
[im 1/2]
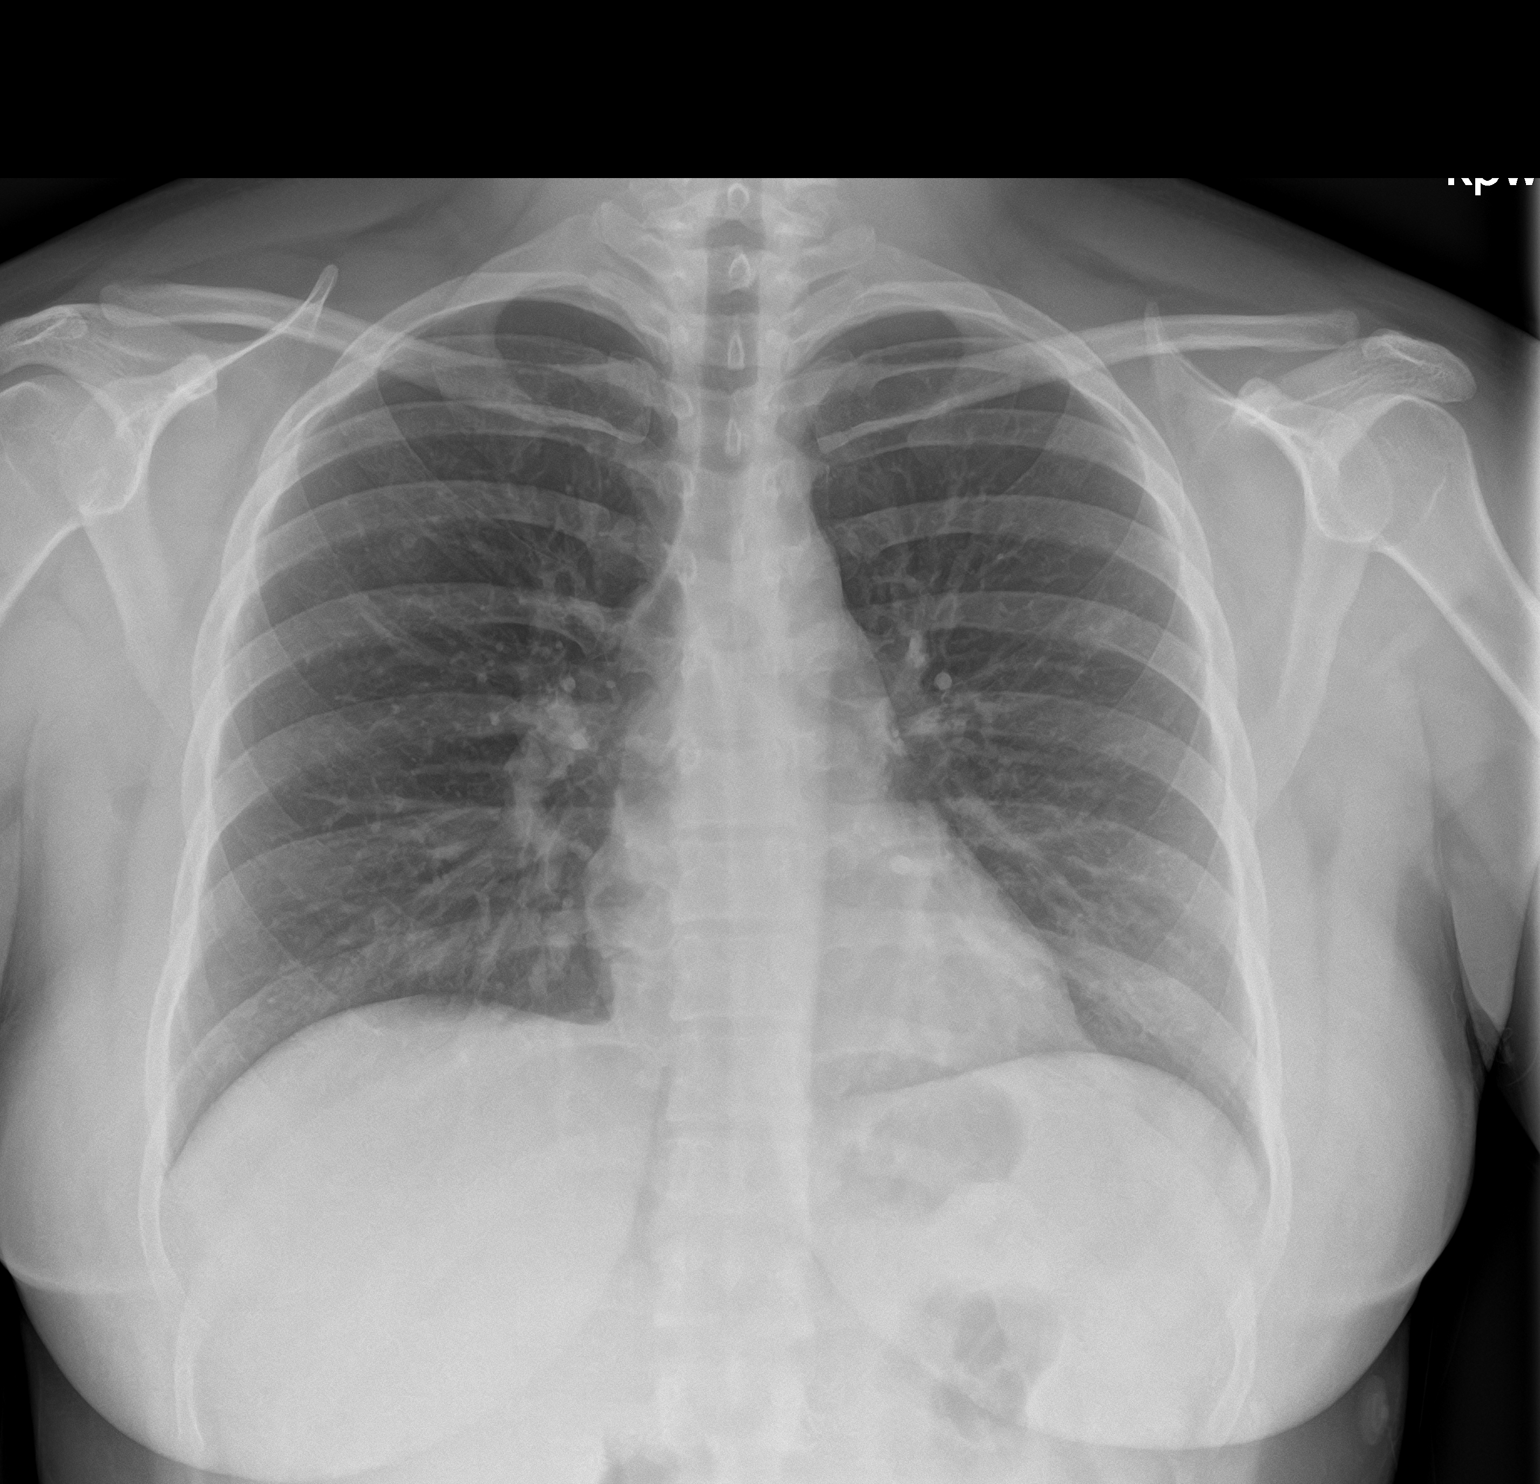
[im 2/2]
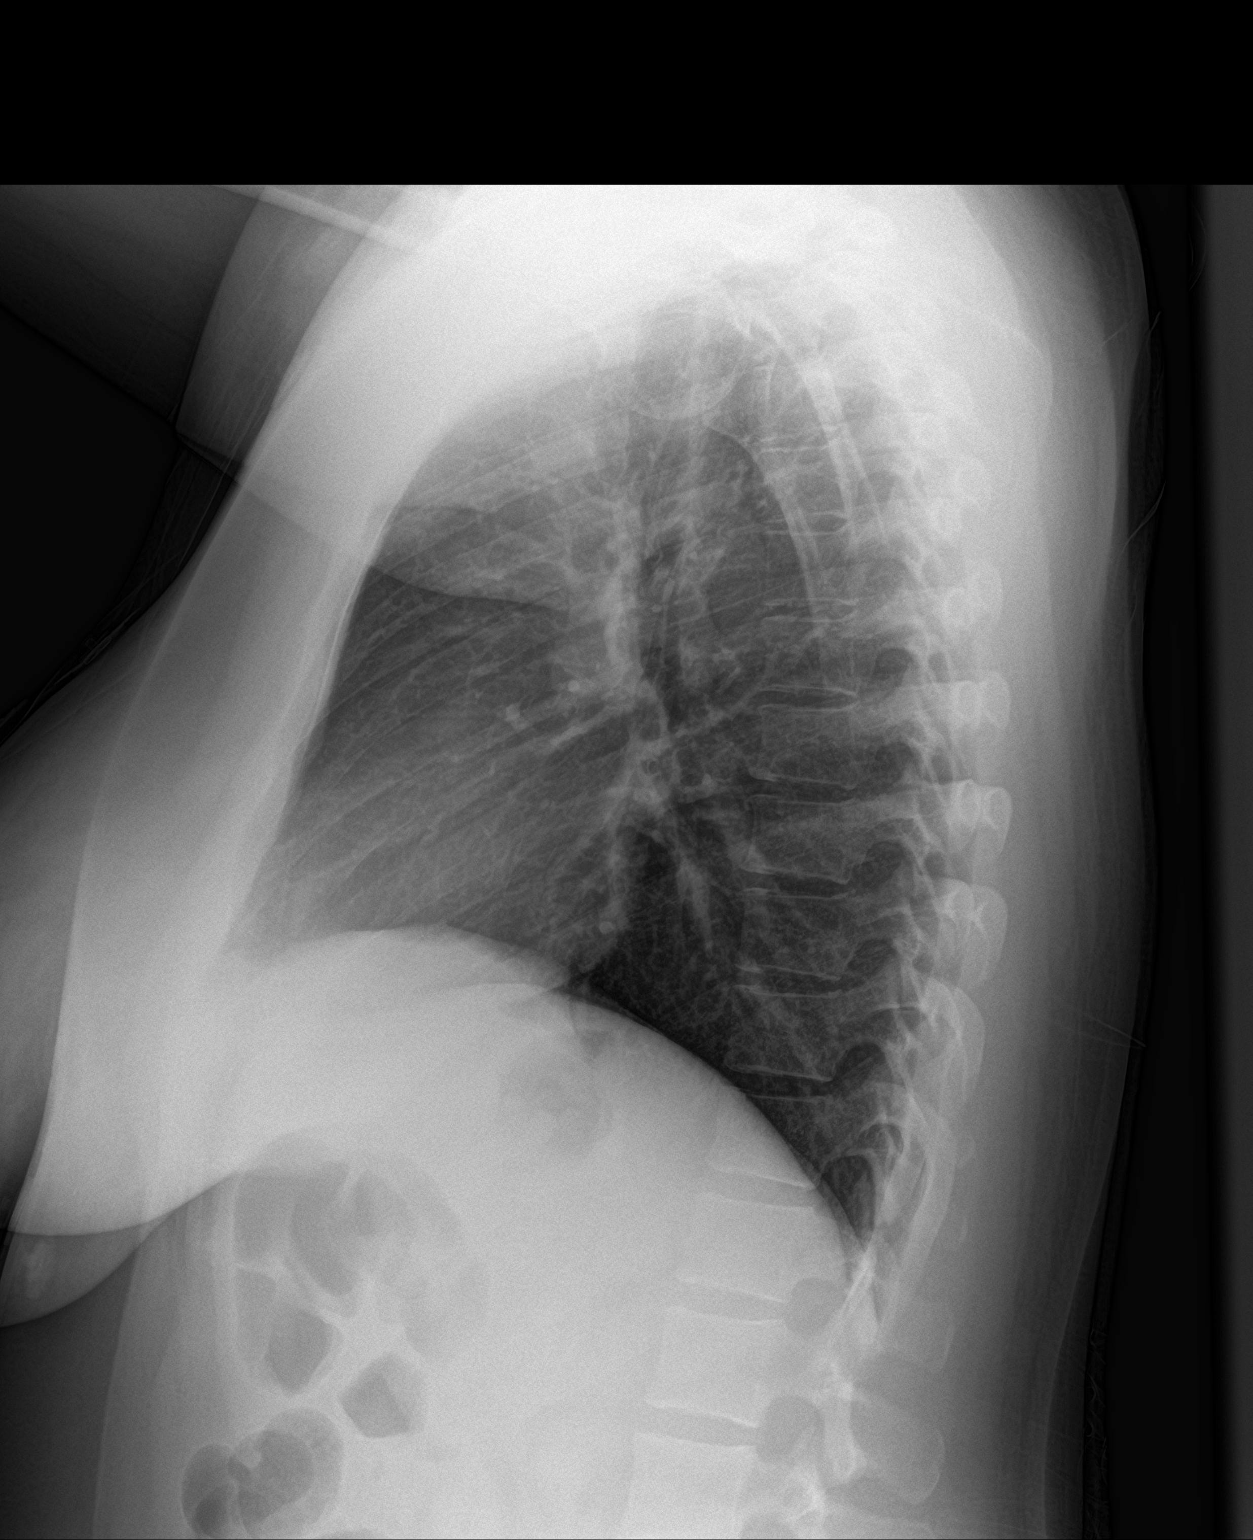

[2 of 2 positions shown; findings below may reference images not displayed]

FINDINGS: The heart size and mediastinal contours are within normal limits.
Both lungs are clear. The visualized skeletal structures are
unremarkable.
IMPRESSION: No active cardiopulmonary disease.

## 2023-02-02 ENCOUNTER — Emergency Department: Payer: Self-pay

## 2023-02-02 ENCOUNTER — Emergency Department
Admission: EM | Admit: 2023-02-02 | Discharge: 2023-02-03 | Disposition: A | Discharge status: Home / Self Care | Payer: Self-pay | Attending: Emergency Medicine | Admitting: Emergency Medicine

## 2023-02-02 ENCOUNTER — Encounter: Payer: Self-pay | Admitting: Emergency Medicine

## 2023-02-02 DIAGNOSIS — W460XXA Contact with hypodermic needle, initial encounter: Secondary | ICD-10-CM | POA: Insufficient documentation

## 2023-02-02 DIAGNOSIS — Z23 Encounter for immunization: Secondary | ICD-10-CM | POA: Insufficient documentation

## 2023-02-02 DIAGNOSIS — S90852A Superficial foreign body, left foot, initial encounter: Secondary | ICD-10-CM | POA: Insufficient documentation

## 2023-02-02 DIAGNOSIS — J45909 Unspecified asthma, uncomplicated: Secondary | ICD-10-CM | POA: Insufficient documentation

## 2023-02-02 MED ORDER — CLINDAMYCIN HCL 150 MG PO CAPS
300.0000 mg | ORAL_CAPSULE | Freq: Once | ORAL | Status: AC
  Administered 2023-02-02: 300 mg via ORAL
  Filled 2023-02-02: qty 2

## 2023-02-02 MED ORDER — HYDROCODONE-ACETAMINOPHEN 5-325 MG PO TABS: 1.0000 | ORAL_TABLET | Freq: Four times a day (QID) | ORAL | 0 refills | Status: AC | PRN

## 2023-02-02 MED ORDER — CEPHALEXIN 500 MG PO CAPS
500.0000 mg | ORAL_CAPSULE | Freq: Once | ORAL | Status: AC
  Administered 2023-02-02: 500 mg via ORAL
  Filled 2023-02-02: qty 1

## 2023-02-02 MED ORDER — TETANUS-DIPHTH-ACELL PERTUSSIS 5-2.5-18.5 LF-MCG/0.5 IM SUSY
0.5000 mL | PREFILLED_SYRINGE | Freq: Once | INTRAMUSCULAR | Status: AC
  Administered 2023-02-02: 0.5 mL via INTRAMUSCULAR
  Filled 2023-02-02: qty 0.5

## 2023-02-02 MED ORDER — CEPHALEXIN 500 MG PO CAPS: 500.0000 mg | ORAL_CAPSULE | Freq: Three times a day (TID) | ORAL | 0 refills | Status: AC

## 2023-02-02 MED ORDER — DOXYCYCLINE HYCLATE 100 MG PO TABS: 100.0000 mg | ORAL_TABLET | Freq: Once | ORAL | Status: DC

## 2023-02-02 MED ORDER — HYDROCODONE-ACETAMINOPHEN 5-325 MG PO TABS
1.0000 | ORAL_TABLET | Freq: Once | ORAL | Status: AC
  Administered 2023-02-02: 1 via ORAL
  Filled 2023-02-02: qty 1

## 2023-02-02 NOTE — Discharge Instructions (Addendum)
1.  Take and finish antibiotics as prescribed.  They are safe for breast-feeding. 2.  You may take Ibuprofen as needed for pain; Norco as needed for more severe pain.  If you take Norco, pump and dump of the first ounce of breastmilk. 3.  Use crutches to help you balance as you walk. 4.  Return to the ER for worsening symptoms, increased redness/swelling, purulent discharge, fever or other concerns.

## 2023-02-02 NOTE — ED Provider Notes (Signed)
[facility] Provider Note    Event Date/Time   First MD Initiated Contact with Patient 02/02/23 2302     (approximate)   History   Foreign Body   HPI  Brooke Logan is a 27 y.o. female who presents to the ED from home with a chief complaint of foreign body in left foot.  Patient was walking naked through her carpeted floor when she stepped onto a sewing needle which broke off inside her foot.  This occurred approximately 30 minutes prior to arrival.  Tetanus is not up-to-date.  Voices no other complaints or injuries.     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 ago   Dyspnea    Reports feeling SOB @ night & wakes her up   Headache    Reports Migraine HA's 2x/wk-not had x 2 mths.   Infection    As a child-developed wound infection, hospitalized 3 mths.-[place]     Active Problem List   Patient Active Problem List   Diagnosis Date Noted   NSVD (normal spontaneous vaginal delivery) 10/12/2019   Bacterial vaginosis 10/09/2019   Asthma affecting pregnancy in second trimester 06/19/2019     Past Surgical History   Past Surgical History:  Procedure Laterality Date   APPENDECTOMY  2013     Home Medications   Prior to Admission medications   Medication Sig Start Date End Date Taking? Authorizing Provider  cephALEXin (KEFLEX) 500 MG capsule Take 1 capsule (500 mg total) by mouth 3 (three) times daily. 02/02/23  Yes Irean Hong, MD  HYDROcodone-acetaminophen (NORCO) 5-325 MG tablet Take 1 tablet by mouth every 6 (six) hours as needed for moderate pain (pain score 4-6). 02/02/23  Yes Irean Hong, MD  ibuprofen (ADVIL) 600 MG tablet Take 1 tablet (600 mg total) by mouth every 6 (six) hours as needed for mild pain, moderate pain or cramping. 10/14/19   Genia Del, CNM  ondansetron (ZOFRAN ODT) 4 MG  disintegrating tablet Take 1 tablet (4 mg total) by mouth every 8 (eight) hours as needed for nausea or vomiting. 01/01/21   Chesley Noon, MD  Prenatal Vit-Fe Fumarate-FA (PRENATAL VITAMIN) 27-0.8 MG TABS Take 1 tablet by mouth daily. 06/19/19   Alberteen Spindle, CNM     Allergies  Patient has no known allergies.   Family History   Family History  Problem Relation Age of Onset   Heart disease Paternal Grandmother      Physical Exam  Triage Vital Signs: ED Triage Vitals [02/02/23 2247]  Encounter Vitals Group     BP 119/71     Systolic BP Percentile      Diastolic BP Percentile      Pulse Rate 93     Resp 15     Temp 98 F (36.7 C)     Temp Source Oral     SpO2 100 %     Weight      Height      Head Circumference      Peak Flow      Pain Score      Pain Loc      Pain Education      Exclude from Growth Chart     Updated Vital Signs: BP 119/71 (BP Location: Left Arm)   Pulse 93   Temp  98 F (36.7 C) (Oral)   Resp 15   SpO2 100%    General: Awake, no distress.  CV:  Good peripheral perfusion.  Resp:  Normal effort.  Abd:  No distention.  Other:  Left foot: Tiny puncture noted to sole of foot at the base of third digit without active bleeding.  No palpable foreign body but exquisitely tender to palpation.  2+ distal pulses.  Brisk, less than 5-second capillary refill.   ED Results / Procedures / Treatments  Labs (all labs ordered are listed, but only abnormal results are displayed) Labs Reviewed - No data to display   EKG  None   RADIOLOGY I have independently visualized and interpreted patient's imaging study as well as noted the radiology interpretation:  Left foot: Linear foreign body noted at 3rd/4th interspace  Official radiology report(s): DG Foot Complete Left  Result Date: 02/02/2023 CLINICAL DATA:  Stepped on a sewing needle. EXAM: LEFT FOOT - COMPLETE 3+ VIEW COMPARISON:  None Available. FINDINGS: There is no evidence of fracture or  dislocation. There is no evidence of arthropathy or other focal bone abnormality. Linear 19 mm radiopaque foreign body consistent with selling needle at the 3rd-4th interspace. IMPRESSION: Linear 19 mm radiopaque foreign body consistent with sewing needle at the 3rd-4th interspace. Electronically Signed   By: Narda Rutherford M.D.   On: 02/02/2023 23:48     PROCEDURES:  Critical Care performed: No  Procedures   MEDICATIONS ORDERED IN ED: Medications  Tdap (BOOSTRIX) injection 0.5 mL (0.5 mLs Intramuscular Given 02/02/23 2349)  HYDROcodone-acetaminophen (NORCO/VICODIN) 5-325 MG per tablet 1 tablet (1 tablet Oral Given 02/02/23 2347)  cephALEXin (KEFLEX) capsule 500 mg (500 mg Oral Given 02/02/23 2347)  clindamycin (CLEOCIN) capsule 300 mg (300 mg Oral Given 02/02/23 2347)     IMPRESSION / MDM / ASSESSMENT AND PLAN / ED COURSE  I reviewed the triage vital signs and the nursing notes.                             27 year old female presenting with foreign body in left foot.  I have independently viewed patient's x-rays and notes broken linear object consistent with history of stepping on needle very deep at the base of third digit.  Unsure if object pierces bone.  Given depth of foreign body, tiny size and location, would need to sedate patient for removal.  Will discuss with podiatry.  Patient's presentation is most consistent with acute, uncomplicated illness.   Clinical Course as of 02/02/23 2358  Tue Feb 02, 2023  2345 Discussed case with Dr. Logan Bores from podiatry who agrees with either sedation and removal of foreign body in the ED; however, he does agree this could be quite difficult.  Other option is for patient to follow-up in clinic or if unsuccessful foreign body removal in the ED, follow-up in clinic with likely plans for OR.  I had an extensive discussion with the patient and her husband.  She has a 26-year-old son at home and did not realize her ED visit would be so long and  involved.  She has to go home to breast-feed her son.  Will update her tetanus, double cover with Keflex and Clindamycin as these would be the safest for breast-feeding, prescribe as needed Norco and patient will follow-up in podiatry clinic closely.  Strict return precautions given.  Patient and spouse verbalized understanding and agree with plan of care. [JS]    Clinical  Course User Index [JS] Irean Hong, MD     FINAL CLINICAL IMPRESSION(S) / ED DIAGNOSES   Final diagnoses:  Foreign body in foot, left, initial encounter     Rx / DC Orders   ED Discharge Orders          Ordered    cephALEXin (KEFLEX) 500 MG capsule  3 times daily        02/02/23 2341    HYDROcodone-acetaminophen (NORCO) 5-325 MG tablet  Every 6 hours PRN        02/02/23 2341             Note:  This document was prepared using Dragon voice recognition software and may include unintentional dictation errors.   Irean Hong, MD 02/03/23 [phone]

## 2023-02-02 NOTE — ED Triage Notes (Signed)
Pt POV to ED due to stepping onto a sewing needle approx 30 minutes prior to arrival. Pt with broken 1.5cm in length needle in left foot. Unable to see on assessment.

## 2023-02-03 ENCOUNTER — Ambulatory Visit: Payer: Self-pay | Admitting: Podiatry

## 2023-02-03 ENCOUNTER — Emergency Department
Admission: EM | Admit: 2023-02-03 | Discharge: 2023-02-03 | Disposition: A | Discharge status: Home / Self Care | Payer: Self-pay | Attending: Emergency Medicine | Admitting: Emergency Medicine

## 2023-02-03 ENCOUNTER — Other Ambulatory Visit: Payer: Self-pay

## 2023-02-03 ENCOUNTER — Encounter: Payer: Self-pay | Admitting: Emergency Medicine

## 2023-02-03 DIAGNOSIS — W458XXA Other foreign body or object entering through skin, initial encounter: Secondary | ICD-10-CM | POA: Insufficient documentation

## 2023-02-03 DIAGNOSIS — M795 Residual foreign body in soft tissue: Secondary | ICD-10-CM

## 2023-02-03 DIAGNOSIS — S90852A Superficial foreign body, left foot, initial encounter: Secondary | ICD-10-CM | POA: Insufficient documentation

## 2023-02-03 NOTE — ED Triage Notes (Signed)
Pt to triage via wc with w/c with no distress noted; pt here last night for sewing needle in left foot; had xray but st she chose not to have it removed; f/u elsewhere today but they required "too much money" so she is back to have it removed here

## 2023-02-03 NOTE — ED Provider Notes (Signed)
   [facility] Provider Note    Event Date/Time   First MD Initiated Contact with Patient 02/03/23 1952     (approximate)   History   Foreign Body   HPI  Brooke Logan [place] is a 27 y.o. female who has a sewing needle foreign body in her foot.  She was seen for this yesterday and referred to podiatry given the complexity of removing it.  However she has represented to the emergency department today, no change in her symptoms     Physical Exam   Triage Vital Signs: ED Triage Vitals  Encounter Vitals Group     BP 02/03/23 1910 132/88     Systolic BP Percentile --      Diastolic BP Percentile --      Pulse Rate 02/03/23 1910 (!) 112     Resp 02/03/23 1910 19     Temp 02/03/23 1910 98.6 F (37 C)     Temp src --      SpO2 02/03/23 1910 97 %     Weight 02/03/23 1928 81.6 kg (180 lb)     Height 02/03/23 1928 1.626 m (5\' 4" )     Head Circumference --      Peak Flow --      Pain Score 02/03/23 1928 5     Pain Loc --      Pain Education --      Exclude from Growth Chart --     Most recent vital signs: Vitals:   02/03/23 1910  BP: 132/88  Pulse: (!) 112  Resp: 19  Temp: 98.6 F (37 C)  SpO2: 97%     General: Awake, no distress.  CV:  Good peripheral perfusion.  Resp:  Normal effort.  Abd:  No distention.  Other:  No bleeding, unable to palpate or determine where it is  ED Results / Procedures / Treatments   Labs (all labs ordered are listed, but only abnormal results are displayed) Labs Reviewed - No data to display   EKG     RADIOLOGY Reviewed x-ray from yesterday    PROCEDURES:  Critical Care performed:   Procedures   MEDICATIONS ORDERED IN ED: Medications - No data to display   IMPRESSION / MDM / ASSESSMENT AND PLAN / ED COURSE  I reviewed the triage vital signs and the nursing notes. Patient's presentation is most consistent with acute complicated illness / injury requiring diagnostic  workup.  Patient with needle in the foot, I do not feel comfortable trying to extract this given it is extremely difficult to determine where it is exactly.  I discussed with her that she will need to follow-up with podiatry for specialty care        FINAL CLINICAL IMPRESSION(S) / ED DIAGNOSES   Final diagnoses:  Foreign body (FB) in soft tissue     Rx / DC Orders   ED Discharge Orders     None        Note:  This document was prepared using Dragon voice recognition software and may include unintentional dictation errors.   Jene Every, MD 02/03/23 2220

## 2023-02-03 NOTE — ED Notes (Signed)
Patient given discharge instructions including prescriptions x2 and importance of follow up appt with stated understanding. Patient sized appropriately for crutches with return demonstration. Patient stable and ambulatory with crutches on dispo.
